# Patient Record
Sex: Male | Born: 1950
Health system: Southern US, Community
[De-identification: ages and names within clinical notes are randomized; demographics above are authoritative.]

## PROBLEM LIST (undated history)

## (undated) DIAGNOSIS — I10 Essential (primary) hypertension: Secondary | ICD-10-CM

## (undated) DIAGNOSIS — E785 Hyperlipidemia, unspecified: Secondary | ICD-10-CM

## (undated) DIAGNOSIS — J449 Chronic obstructive pulmonary disease, unspecified: Secondary | ICD-10-CM

## (undated) DIAGNOSIS — F419 Anxiety disorder, unspecified: Secondary | ICD-10-CM

## (undated) DIAGNOSIS — Z720 Tobacco use: Secondary | ICD-10-CM

## (undated) HISTORY — DX: Essential (primary) hypertension: I10

## (undated) HISTORY — DX: Tobacco use: Z72.0

## (undated) HISTORY — PX: SPINE SURGERY: SHX786

## (undated) HISTORY — DX: Anxiety disorder, unspecified: F41.9

## (undated) HISTORY — DX: Chronic obstructive pulmonary disease, unspecified: J44.9

## (undated) HISTORY — DX: Hyperlipidemia, unspecified: E78.5

## (undated) HISTORY — PX: REDUCTION OF TORSION OF TESTIS: SUR1096

## (undated) HISTORY — PX: APPENDECTOMY: SHX54

---

## 2003-12-29 ENCOUNTER — Ambulatory Visit: Payer: Self-pay | Admitting: Family Medicine

## 2004-01-05 ENCOUNTER — Ambulatory Visit: Payer: Self-pay | Admitting: Family Medicine

## 2004-03-02 ENCOUNTER — Ambulatory Visit: Payer: Self-pay | Admitting: Internal Medicine

## 2004-03-23 ENCOUNTER — Ambulatory Visit: Payer: Self-pay | Admitting: Family Medicine

## 2005-05-01 ENCOUNTER — Ambulatory Visit: Payer: Self-pay | Admitting: Family Medicine

## 2005-05-08 ENCOUNTER — Ambulatory Visit: Payer: Self-pay | Admitting: Family Medicine

## 2005-05-26 ENCOUNTER — Ambulatory Visit: Payer: Self-pay | Admitting: Gastroenterology

## 2005-06-07 ENCOUNTER — Ambulatory Visit: Payer: Self-pay | Admitting: Gastroenterology

## 2006-04-09 ENCOUNTER — Ambulatory Visit: Payer: Self-pay | Admitting: Internal Medicine

## 2006-05-04 ENCOUNTER — Ambulatory Visit: Payer: Self-pay | Admitting: Family Medicine

## 2006-05-04 LAB — CONVERTED CEMR LAB
AST: 17 units/L (ref 0–37)
Albumin: 4 g/dL (ref 3.5–5.2)
BUN: 9 mg/dL (ref 6–23)
Basophils Relative: 0.1 % (ref 0.0–1.0)
Bilirubin, Direct: 0.1 mg/dL (ref 0.0–0.3)
Cholesterol: 155 mg/dL (ref 0–200)
Eosinophils Absolute: 0.2 10*3/uL (ref 0.0–0.6)
HCT: 42.6 % (ref 39.0–52.0)
Hemoglobin: 15.2 g/dL (ref 13.0–17.0)
LDL Cholesterol: 102 mg/dL — ABNORMAL HIGH (ref 0–99)
MCHC: 35.7 g/dL (ref 30.0–36.0)
MCV: 98.1 fL (ref 78.0–100.0)
Monocytes Absolute: 0.7 10*3/uL (ref 0.2–0.7)
Neutro Abs: 4.3 10*3/uL (ref 1.4–7.7)
Neutrophils Relative %: 62.4 % (ref 43.0–77.0)
Platelets: 130 10*3/uL — ABNORMAL LOW (ref 150–400)
RBC: 4.34 M/uL (ref 4.22–5.81)
RDW: 11.6 % (ref 11.5–14.6)
TSH: 1.24 microintl units/mL (ref 0.35–5.50)
Total Bilirubin: 1.1 mg/dL (ref 0.3–1.2)
Total CHOL/HDL Ratio: 4.9
Triglycerides: 106 mg/dL (ref 0–149)
WBC: 7 10*3/uL (ref 4.5–10.5)

## 2006-05-17 ENCOUNTER — Ambulatory Visit: Payer: Self-pay | Admitting: Family Medicine

## 2006-10-01 DIAGNOSIS — I10 Essential (primary) hypertension: Secondary | ICD-10-CM | POA: Insufficient documentation

## 2006-11-15 ENCOUNTER — Telehealth: Payer: Self-pay | Admitting: Family Medicine

## 2007-03-11 ENCOUNTER — Telehealth: Payer: Self-pay | Admitting: Family Medicine

## 2007-06-21 ENCOUNTER — Telehealth: Payer: Self-pay | Admitting: *Deleted

## 2007-07-05 ENCOUNTER — Telehealth: Payer: Self-pay | Admitting: *Deleted

## 2007-09-24 ENCOUNTER — Telehealth: Payer: Self-pay | Admitting: *Deleted

## 2007-10-20 ENCOUNTER — Emergency Department (HOSPITAL_BASED_OUTPATIENT_CLINIC_OR_DEPARTMENT_OTHER): Admission: EM | Admit: 2007-10-20 | Discharge: 2007-10-20 | Payer: Self-pay | Admitting: Emergency Medicine

## 2007-10-30 ENCOUNTER — Ambulatory Visit: Payer: Self-pay | Admitting: Family Medicine

## 2007-10-30 LAB — CONVERTED CEMR LAB
ALT: 15 units/L (ref 0–53)
Albumin: 4.1 g/dL (ref 3.5–5.2)
Alkaline Phosphatase: 55 units/L (ref 39–117)
BUN: 12 mg/dL (ref 6–23)
Basophils Absolute: 0 10*3/uL (ref 0.0–0.1)
Basophils Relative: 0.2 % (ref 0.0–3.0)
Calcium: 8.8 mg/dL (ref 8.4–10.5)
Direct LDL: 134.1 mg/dL
Eosinophils Relative: 2.2 % (ref 0.0–5.0)
Glucose, Bld: 86 mg/dL (ref 70–99)
Hemoglobin: 15.5 g/dL (ref 13.0–17.0)
Monocytes Relative: 7.9 % (ref 3.0–12.0)
Neutro Abs: 4.6 10*3/uL (ref 1.4–7.7)
Nitrite: NEGATIVE
PSA: 0.56 ng/mL (ref 0.10–4.00)
Platelets: 112 10*3/uL — ABNORMAL LOW (ref 150–400)
Potassium: 4 meq/L (ref 3.5–5.1)
Protein, U semiquant: NEGATIVE
RBC: 4.42 M/uL (ref 4.22–5.81)
RDW: 11.8 % (ref 11.5–14.6)
Sodium: 143 meq/L (ref 135–145)
Triglycerides: 100 mg/dL (ref 0–149)
VLDL: 20 mg/dL (ref 0–40)
WBC Urine, dipstick: NEGATIVE
WBC: 7.2 10*3/uL (ref 4.5–10.5)

## 2007-11-05 ENCOUNTER — Ambulatory Visit: Payer: Self-pay | Admitting: Family Medicine

## 2007-11-05 DIAGNOSIS — F17200 Nicotine dependence, unspecified, uncomplicated: Secondary | ICD-10-CM | POA: Insufficient documentation

## 2007-11-19 ENCOUNTER — Ambulatory Visit: Payer: Self-pay | Admitting: Family Medicine

## 2007-12-10 ENCOUNTER — Ambulatory Visit: Payer: Self-pay | Admitting: Family Medicine

## 2007-12-27 ENCOUNTER — Telehealth: Payer: Self-pay | Admitting: *Deleted

## 2008-03-13 ENCOUNTER — Encounter: Payer: Self-pay | Admitting: Family Medicine

## 2008-04-22 ENCOUNTER — Telehealth: Payer: Self-pay | Admitting: Family Medicine

## 2008-07-30 ENCOUNTER — Telehealth: Payer: Self-pay | Admitting: Family Medicine

## 2008-11-09 ENCOUNTER — Telehealth: Payer: Self-pay | Admitting: Family Medicine

## 2009-01-05 ENCOUNTER — Ambulatory Visit: Payer: Self-pay | Admitting: Family Medicine

## 2009-01-05 LAB — CONVERTED CEMR LAB
Albumin: 3.9 g/dL (ref 3.5–5.2)
BUN: 10 mg/dL (ref 6–23)
Basophils Relative: 0.4 % (ref 0.0–3.0)
Calcium: 8.6 mg/dL (ref 8.4–10.5)
Chloride: 107 meq/L (ref 96–112)
Cholesterol: 149 mg/dL (ref 0–200)
GFR calc non Af Amer: 92.44 mL/min (ref >60)
Glucose, Bld: 93 mg/dL (ref 70–99)
HDL: 32.3 mg/dL — ABNORMAL LOW (ref >39.00)
Ketones, urine, test strip: NEGATIVE
Lymphocytes Relative: 29.1 % (ref 12.0–46.0)
MCHC: 34.8 g/dL (ref 30.0–36.0)
MCV: 100.2 fL — ABNORMAL HIGH (ref 78.0–100.0)
Monocytes Absolute: 0.6 10*3/uL (ref 0.1–1.0)
Neutro Abs: 4.2 10*3/uL (ref 1.4–7.7)
Nitrite: NEGATIVE
PSA: 0.73 ng/mL (ref 0.10–4.00)
Platelets: 147 10*3/uL — ABNORMAL LOW (ref 150.0–400.0)
Protein, U semiquant: NEGATIVE
RBC: 3.91 M/uL — ABNORMAL LOW (ref 4.22–5.81)
RDW: 12.1 % (ref 11.5–14.6)
TSH: 0.99 microintl units/mL (ref 0.35–5.50)
Total CHOL/HDL Ratio: 5
Triglycerides: 101 mg/dL (ref 0.0–149.0)
WBC Urine, dipstick: NEGATIVE
pH: 5.5

## 2009-01-18 ENCOUNTER — Ambulatory Visit: Payer: Self-pay | Admitting: Family Medicine

## 2009-01-18 DIAGNOSIS — N529 Male erectile dysfunction, unspecified: Secondary | ICD-10-CM

## 2009-02-26 ENCOUNTER — Telehealth: Payer: Self-pay | Admitting: Family Medicine

## 2009-03-03 ENCOUNTER — Encounter: Payer: Self-pay | Admitting: *Deleted

## 2009-03-03 DIAGNOSIS — F988 Other specified behavioral and emotional disorders with onset usually occurring in childhood and adolescence: Secondary | ICD-10-CM

## 2009-03-09 ENCOUNTER — Encounter: Payer: Self-pay | Admitting: Family Medicine

## 2009-06-08 ENCOUNTER — Telehealth: Payer: Self-pay | Admitting: Family Medicine

## 2009-10-05 ENCOUNTER — Telehealth: Payer: Self-pay | Admitting: Family Medicine

## 2010-01-13 ENCOUNTER — Telehealth: Payer: Self-pay | Admitting: Family Medicine

## 2010-01-19 ENCOUNTER — Other Ambulatory Visit: Payer: Self-pay | Admitting: Family Medicine

## 2010-01-19 ENCOUNTER — Ambulatory Visit
Admission: RE | Admit: 2010-01-19 | Discharge: 2010-01-19 | Payer: Self-pay | Source: Home / Self Care | Attending: Family Medicine | Admitting: Family Medicine

## 2010-01-19 LAB — CBC WITH DIFFERENTIAL/PLATELET
Basophils Absolute: 0.1 10*3/uL (ref 0.0–0.1)
Basophils Relative: 0.7 % (ref 0.0–3.0)
Eosinophils Absolute: 0.2 10*3/uL (ref 0.0–0.7)
Eosinophils Relative: 2.4 % (ref 0.0–5.0)
HCT: 42.9 % (ref 39.0–52.0)
Hemoglobin: 14.6 g/dL (ref 13.0–17.0)
Lymphocytes Relative: 28.3 % (ref 12.0–46.0)
Lymphs Abs: 2 10*3/uL (ref 0.7–4.0)
MCHC: 33.9 g/dL (ref 30.0–36.0)
MCV: 99.3 fl (ref 78.0–100.0)
Monocytes Absolute: 0.6 10*3/uL (ref 0.1–1.0)
Monocytes Relative: 8 % (ref 3.0–12.0)
Neutro Abs: 4.3 10*3/uL (ref 1.4–7.7)
Neutrophils Relative %: 60.6 % (ref 43.0–77.0)
Platelets: 126 10*3/uL — ABNORMAL LOW (ref 150.0–400.0)
RBC: 4.32 Mil/uL (ref 4.22–5.81)
RDW: 13.1 % (ref 11.5–14.6)
WBC: 7 10*3/uL (ref 4.5–10.5)

## 2010-01-19 LAB — LIPID PANEL
Cholesterol: 172 mg/dL (ref 0–200)
HDL: 32.8 mg/dL — ABNORMAL LOW (ref >39.00)
LDL Cholesterol: 120 mg/dL — ABNORMAL HIGH (ref 0–99)
Total CHOL/HDL Ratio: 5
Triglycerides: 95 mg/dL (ref 0.0–149.0)
VLDL: 19 mg/dL (ref 0.0–40.0)

## 2010-01-19 LAB — HEPATIC FUNCTION PANEL
ALT: 10 U/L (ref 0–53)
AST: 13 U/L (ref 0–37)
Albumin: 3.8 g/dL (ref 3.5–5.2)
Alkaline Phosphatase: 64 U/L (ref 39–117)
Bilirubin, Direct: 0.1 mg/dL (ref 0.0–0.3)
Total Bilirubin: 0.7 mg/dL (ref 0.3–1.2)
Total Protein: 6.6 g/dL (ref 6.0–8.3)

## 2010-01-19 LAB — BASIC METABOLIC PANEL
BUN: 14 mg/dL (ref 6–23)
CO2: 28 mEq/L (ref 19–32)
Calcium: 8.8 mg/dL (ref 8.4–10.5)
Chloride: 107 mEq/L (ref 96–112)
Creatinine, Ser: 0.9 mg/dL (ref 0.4–1.5)
GFR: 92.1 mL/min (ref >60.00)
Glucose, Bld: 76 mg/dL (ref 70–99)
Potassium: 4.5 mEq/L (ref 3.5–5.1)
Sodium: 141 mEq/L (ref 135–145)

## 2010-01-19 LAB — PSA: PSA: 0.81 ng/mL (ref 0.10–4.00)

## 2010-01-19 LAB — CONVERTED CEMR LAB
Bilirubin Urine: NEGATIVE
Blood in Urine, dipstick: NEGATIVE
Glucose, Urine, Semiquant: NEGATIVE
Ketones, urine, test strip: NEGATIVE
Protein, U semiquant: NEGATIVE
Specific Gravity, Urine: 1.025
Urobilinogen, UA: 0.2

## 2010-01-19 LAB — TSH: TSH: 1.38 u[IU]/mL (ref 0.35–5.50)

## 2010-02-03 ENCOUNTER — Ambulatory Visit
Admission: RE | Admit: 2010-02-03 | Discharge: 2010-02-03 | Payer: Self-pay | Source: Home / Self Care | Attending: Family Medicine | Admitting: Family Medicine

## 2010-02-03 ENCOUNTER — Encounter: Payer: Self-pay | Admitting: Family Medicine

## 2010-02-15 NOTE — Medication Information (Signed)
Summary: Coverage Approval for Adderall  Coverage Approval for Adderall   Imported By: Maryln Gottron 03/11/2009 13:53:52  _____________________________________________________________________  External Attachment:    Type:   Image     Comment:   External Document

## 2010-02-15 NOTE — Miscellaneous (Signed)
   Clinical Lists Changes  Problems: Added new problem of ATTENTION DEFICIT DISORDER, ADULT (ICD-314.00)

## 2010-02-15 NOTE — Assessment & Plan Note (Signed)
Summary: cpx/njr rsc bmp/njr   Vital Signs:  Patient profile:   60 year old male Height:      68 inches Weight:      171 pounds BMI:     26.09 Temp:     99.1 degrees F oral BP sitting:   130 / 80  (left arm) Cuff size:   regular  Vitals Entered By: Kern Reap CMA Duncan Dull) (January 18, 2009 2:49 PM)  Reason for Visit cpx  History of Present Illness: Bobby Cuevas is a 60 year old, married male, nonsmoker, x 2 days.  The comes in today for evaluation.  He finally decided to stop smoking.  We had initially tried the chantix program however, he did not buy the medication because he was concerned about the potential side effects.  Two days ago, and he stop smoking completely and is using a nicotine patch.  We outlined the process, and he will call if he would like to retry the chantix.  He takes Adderall 20 mg long-acting daily for adult ADD doing well.  He takes Zestril 40 mg daily for hypertension.  BP 130/80.  Also one aspirin tablet daily, and Cardura 8 mg nightly.  He also uses Viagra 50 mg p.r.n. for erectile dysfunction.  He does not get routine eye care, nor dental care.  Advised routine eye exams yearly dental checks every 6 months.  Colonoscopy and GI normal 2008 tetanus booster 2003, seasonal flu 2010.  Allergies: No Known Drug Allergies  Past History:  Past medical, surgical, family and social histories (including risk factors) reviewed, and no changes noted (except as noted below).  Past Medical History: Reviewed history from 10/01/2006 and no changes required. TAB Abuse High cholesterol Hypertension  Past Surgical History: Reviewed history from 10/01/2006 and no changes required. Appendectomy  Family History: Reviewed history from 10/01/2006 and no changes required. Family History of Alcoholism/Addiction Family History Hypertension Family History Other cancer Family History of Respiratory disease  Social History: Reviewed history from 11/05/2007 and no  changes required. Occupation: Married Current Smoker Alcohol use-yes Regular exercise-no  Review of Systems      See HPI  Physical Exam  General:  Well-developed,well-nourished,in no acute distress; alert,appropriate and cooperative throughout examination Head:  Normocephalic and atraumatic without obvious abnormalities. No apparent alopecia or balding. Eyes:  No corneal or conjunctival inflammation noted. EOMI. Perrla. Funduscopic exam benign, without hemorrhages, exudates or papilledema. Vision grossly normal. Ears:  External ear exam shows no significant lesions or deformities.  Otoscopic examination reveals clear canals, tympanic membranes are intact bilaterally without bulging, retraction, inflammation or discharge. Hearing is grossly normal bilaterally. Nose:  External nasal examination shows no deformity or inflammation. Nasal mucosa are pink and moist without lesions or exudates. Mouth:  Oral mucosa and oropharynx without lesions or exudates.  Teeth in good repair. Neck:  No deformities, masses, or tenderness noted. Chest Wall:  No deformities, masses, tenderness or gynecomastia noted. Breasts:  No masses or gynecomastia noted Lungs:  Normal respiratory effort, chest expands symmetrically. Lungs are clear to auscultation, no crackles or wheezes. Heart:  Normal rate and regular rhythm. S1 and S2 normal without gallop, murmur, click, rub or other extra sounds. Abdomen:  Bowel sounds positive,abdomen soft and non-tender without masses, organomegaly or hernias noted. Rectal:  No external abnormalities noted. Normal sphincter tone. No rectal masses or tenderness. Genitalia:  Testes bilaterally descended without nodularity, tenderness or masses. No scrotal masses or lesions. No penis lesions or urethral discharge. Prostate:  Prostate gland firm and smooth,  no enlargement, nodularity, tenderness, mass, asymmetry or induration. Msk:  No deformity or scoliosis noted of thoracic or lumbar  spine.   Pulses:  R and L carotid,radial,femoral,dorsalis pedis and posterior tibial pulses are full and equal bilaterally Extremities:  No clubbing, cyanosis, edema, or deformity noted with normal full range of motion of all joints.   Neurologic:  No cranial nerve deficits noted. Station and gait are normal. Plantar reflexes are down-going bilaterally. DTRs are symmetrical throughout. Sensory, motor and coordinative functions appear intact. Skin:  total body skin exam normal except for scar right lower quadrant from previous appendectomy Cervical Nodes:  No lymphadenopathy noted Axillary Nodes:  No palpable lymphadenopathy Inguinal Nodes:  No significant adenopathy Psych:  Cognition and judgment appear intact. Alert and cooperative with normal attention span and concentration. No apparent delusions, illusions, hallucinations   Impression & Recommendations:  Problem # 1:  TOBACCO ABUSE (ICD-305.1) Assessment Improved  The following medications were removed from the medication list:    Chantix Starting Month Pak 0.5 Mg X 11 & 1 Mg X 42 Misc (Varenicline tartrate) ..... Uad  Orders: Prescription Created Electronically [phone]) Tobacco use cessation intermediate 3-10 minutes (60454)  Problem # 2:  HYPERTENSION (ICD-401.9) Assessment: Improved  The following medications were removed from the medication list:    Zestril 20 Mg Tabs (Lisinopril) .Marland Kitchen... 1 and half  tab @ bedtime His updated medication list for this problem includes:    Doxazosin Mesylate 8 Mg Tabs (Doxazosin mesylate) ..... Once daily    Zestril 40 Mg Tabs (Lisinopril) .Marland Kitchen... Take 1 tablet by mouth every morning  Orders: EKG w/ Interpretation (93000) Prescription Created Electronically [phone]) Tobacco use cessation intermediate 3-10 minutes (91478)  Problem # 3:  ERECTILE DYSFUNCTION, ORGANIC (ICD-607.84) Assessment: Improved  His updated medication list for this problem includes:    Viagra 50 Mg Tabs (Sildenafil  citrate) ..... Uad  Orders: Prescription Created Electronically [phone]) Tobacco use cessation intermediate 3-10 minutes (13086)  Complete Medication List: 1)  Doxazosin Mesylate 8 Mg Tabs (Doxazosin mesylate) .... Once daily 2)  Adult Aspirin Low Strength 81 Mg Tbdp (Aspirin) 3)  Adderall Xr 10 Mg Cp24 (Amphetamine-dextroamphetamine) .... Take 1 tab once daily-fill now 4)  Adderall Xr 10 Mg Cp24 (Amphetamine-dextroamphetamine) .... Take 1 tab once daily fill in one  month 5)  Adderall Xr 10 Mg Cp24 (Amphetamine-dextroamphetamine) .... Take 1 tab once daily fill in 2 months 6)  Viagra 50 Mg Tabs (Sildenafil citrate) .... Uad 7)  Zestril 40 Mg Tabs (Lisinopril) .... Take 1 tablet by mouth every morning  Patient Instructions: 1)  continued the current treatment program with the nicotine patches.  If however, you decide you want to try the chantix call and leave a voicemail for Fleet Contras we can do it by phone. 2)  Please schedule a follow-up appointment in 1 year. 3)  remembered to give a high exam the every one to two years to screen for glaucoma and regular dental care.  I would recommend Dr. Alvester Morin as a dentist Prescriptions: ZESTRIL 40 MG TABS (LISINOPRIL) Take 1 tablet by mouth every morning  #100 x 3   Entered and Authorized by:   Roderick Pee MD   Signed by:   Roderick Pee MD on 01/18/2009   Method used:   Electronically to        [facility]  [address] [phone]* (retail)       [address]  [place], [place]  [address]       Ph: [phone] or [phone]       Fax: [phone]   RxID:   [id number] VIAGRA 50 MG TABS (SILDENAFIL CITRATE) UAD  #6 x 11   Entered and Authorized by:   Roderick Pee MD   Signed by:   Roderick Pee MD on 01/18/2009   Method used:   Electronically to        [facility]  [address] [phone]* (retail)       [address] [place], [place]  [address]       Ph: [phone] or [phone]       Fax: [phone]   RxID:    [id number] DOXAZOSIN MESYLATE 8 MG TABS (DOXAZOSIN MESYLATE) once daily  #100 x 3   Entered and Authorized by:   Roderick Pee MD   Signed by:   Roderick Pee MD on 01/18/2009   Method used:   [facility]        [facility]  [address] [phone][facility] (retail)       [address] [place], [place]  [address]       Ph: [phone] or [phone]       Fax: [phone]   RxID:   [id number]

## 2010-02-15 NOTE — Progress Notes (Signed)
Summary: adderall  Phone Note Call from Patient Call back at Work Phone [phone]   Reason for Call: Refill Medication Summary of Call: Adderall 10mg . Initial call taken by: Rudy Jew, RN,  February 26, 2009 10:34 AM  Follow-up for Phone Call        rx ready for pick up Follow-up by: Kern Reap CMA Duncan Dull),  February 26, 2009 12:50 PM    Prescriptions: ADDERALL XR 10 MG  CP24 (AMPHETAMINE-DEXTROAMPHETAMINE) TAKE 1 TAB once daily FILL IN 2 MONTHS  #30 x 0   Entered by:   Kern Reap CMA (AAMA)   Authorized by:   Roderick Pee MD   Signed by:   Kern Reap CMA (AAMA) on 02/26/2009   Method used:   Print then Give to Patient   RxID:   [id number] ADDERALL XR 10 MG  CP24 (AMPHETAMINE-DEXTROAMPHETAMINE) TAKE 1 TAB once daily FILL IN ONE  MONTH  #30 x 0   Entered by:   Kern Reap CMA (AAMA)   Authorized by:   Roderick Pee MD   Signed by:   Kern Reap CMA (AAMA) on 02/26/2009   Method used:   Print then Give to Patient   RxID:   [id number] ADDERALL XR 10 MG  CP24 (AMPHETAMINE-DEXTROAMPHETAMINE) TAKE 1 TAB once daily-fill now  #30 x 0   Entered by:   Kern Reap CMA (AAMA)   Authorized by:   Roderick Pee MD   Signed by:   Kern Reap CMA (AAMA) on 02/26/2009   Method used:   Print then Give to Patient   RxID:   [id number]

## 2010-02-15 NOTE — Progress Notes (Signed)
Summary: Pt req refill of Adderall XR 10mg  3 month supply  Phone Note Call from Patient Call back at Work Phone [phone]   Caller: Patient Summary of Call: Pt called req script Adderall XR 10mg  3 month supply. Pls call when ready for pick up.   Initial call taken by: Lucy Antigua,  Jun 08, 2009 11:06 AM  Follow-up for Phone Call        rx ready to pick up Follow-up by: Kern Reap CMA Duncan Dull),  Jun 08, 2009 12:15 PM    Prescriptions: ADDERALL XR 10 MG  CP24 (AMPHETAMINE-DEXTROAMPHETAMINE) TAKE 1 TAB once daily FILL IN 2 MONTHS  #30 x 0   Entered by:   Kern Reap CMA (AAMA)   Authorized by:   Roderick Pee MD   Signed by:   Kern Reap CMA (AAMA) on 06/08/2009   Method used:   Print then Give to Patient   RxID:   [id number] ADDERALL XR 10 MG  CP24 (AMPHETAMINE-DEXTROAMPHETAMINE) TAKE 1 TAB once daily FILL IN ONE  MONTH  #30 x 0   Entered by:   Kern Reap CMA (AAMA)   Authorized by:   Roderick Pee MD   Signed by:   Kern Reap CMA (AAMA) on 06/08/2009   Method used:   Print then Give to Patient   RxID:   [id number] ADDERALL XR 10 MG  CP24 (AMPHETAMINE-DEXTROAMPHETAMINE) TAKE 1 TAB once daily-fill now  #30 x 0   Entered by:   Kern Reap CMA (AAMA)   Authorized by:   Roderick Pee MD   Signed by:   Kern Reap CMA (AAMA) on 06/08/2009   Method used:   Print then Give to Patient   RxID:   [id number]

## 2010-02-15 NOTE — Progress Notes (Signed)
Summary: Refill Adderall XR  Phone Note Call from Patient Call back at Work Phone [phone]   Caller: Patient Call For: Roderick Pee MD Summary of Call: pt needs new rx generic adderall xr 10 mg Initial call taken by: Heron Sabins,  October 05, 2009 12:32 PM  Follow-up for Phone Call        Pt was informed that Rx are ready for pick-up Follow-up by: Kathrynn Speed CMA,  October 05, 2009 2:01 PM    Prescriptions: ADDERALL XR 10 MG  CP24 (AMPHETAMINE-DEXTROAMPHETAMINE) TAKE 1 TAB once daily FILL IN 2 MONTHS  #30 x 0   Entered by:   Kern Reap CMA (AAMA)   Authorized by:   Roderick Pee MD   Signed by:   Kern Reap CMA (AAMA) on 10/05/2009   Method used:   Print then Give to Patient   RxID:   [id number] ADDERALL XR 10 MG  CP24 (AMPHETAMINE-DEXTROAMPHETAMINE) TAKE 1 TAB once daily FILL IN ONE  MONTH  #30 x 0   Entered by:   Kern Reap CMA (AAMA)   Authorized by:   Roderick Pee MD   Signed by:   Kern Reap CMA (AAMA) on 10/05/2009   Method used:   Print then Give to Patient   RxID:   [id number] ADDERALL XR 10 MG  CP24 (AMPHETAMINE-DEXTROAMPHETAMINE) TAKE 1 TAB once daily-fill now  #30 x 0   Entered by:   Kern Reap CMA (AAMA)   Authorized by:   Roderick Pee MD   Signed by:   Kern Reap CMA (AAMA) on 10/05/2009   Method used:   Print then Give to Patient   RxID:   [id number]

## 2010-02-17 NOTE — Assessment & Plan Note (Signed)
Summary: cpx//ccm   Vital Signs:  Patient profile:   60 year old male Height:      68 inches Weight:      163 pounds BMI:     24.87 Temp:     98.2 degrees F oral BP sitting:   110 / 70  (left arm) Cuff size:   regular  Vitals Entered By: Kern Reap CMA Duncan Dull) (February 03, 2010 8:35 AM) CC: cpx Is Patient Diabetic? No Pain Assessment Patient in pain? no        CC:  cpx.  History of Present Illness: Bobby Cuevas is a 60 year old, married male, smoker, 30 cigarettes a day, and comes in today for physical examination because of underlying ADHD, hypertension, tobacco abuse.  His hypertension is treated with Zestril 40 mg daily and Cardura 8 mg daily.  BP 110/70.  His ADHD issue with Adderall 10 mg daily long-acting doing well.  Wishes continue on the medication.  He is now willing to try a smoking cessation program.  We outlined the program was chantix.  He gets routine eye care, dental care, colonoscopy, normal, tetanus, 2003, seasonal flu shot 2011  Is not using the Viagra his erectile dysfunction has resolved on its  Allergies (verified): No Known Drug Allergies  Past History:  Past medical, surgical, family and social histories (including risk factors) reviewed, and no changes noted (except as noted below).  Past Medical History: Reviewed history from 10/01/2006 and no changes required. TAB Abuse High cholesterol Hypertension  Past Surgical History: Reviewed history from 10/01/2006 and no changes required. Appendectomy  Family History: Reviewed history from 10/01/2006 and no changes required. Family History of Alcoholism/Addiction Family History Hypertension Family History Other cancer Family History of Respiratory disease  Social History: Reviewed history from 11/05/2007 and no changes required. Occupation: Married Current Smoker Alcohol use-yes Regular exercise-no  Review of Systems      See HPI  Physical Exam  General:   Well-developed,well-nourished,in no acute distress; alert,appropriate and cooperative throughout examination Head:  Normocephalic and atraumatic without obvious abnormalities. No apparent alopecia or balding. Eyes:  No corneal or conjunctival inflammation noted. EOMI. Perrla. Funduscopic exam benign, without hemorrhages, exudates or papilledema. Vision grossly normal. Ears:  External ear exam shows no significant lesions or deformities.  Otoscopic examination reveals clear canals, tympanic membranes are intact bilaterally without bulging, retraction, inflammation or discharge. Hearing is grossly normal bilaterally. Nose:  External nasal examination shows no deformity or inflammation. Nasal mucosa are pink and moist without lesions or exudates. Mouth:  Oral mucosa and oropharynx without lesions or exudates.  Teeth in good repair. Neck:  No deformities, masses, or tenderness noted. Chest Wall:  No deformities, masses, tenderness or gynecomastia noted. Breasts:  No masses or gynecomastia noted Lungs:  Normal respiratory effort, chest expands symmetrically. Lungs are clear to auscultation, no crackles or wheezes. Heart:  Normal rate and regular rhythm. S1 and S2 normal without gallop, murmur, click, rub or other extra sounds. Abdomen:  Bowel sounds positive,abdomen soft and non-tender without masses, organomegaly or hernias noted. Rectal:  No external abnormalities noted. Normal sphincter tone. No rectal masses or tenderness. Genitalia:  Testes bilaterally descended without nodularity, tenderness or masses. No scrotal masses or lesions. No penis lesions or urethral discharge. Prostate:  Prostate gland firm and smooth, no enlargement, nodularity, tenderness, mass, asymmetry or induration. Msk:  No deformity or scoliosis noted of thoracic or lumbar spine.   Pulses:  R and L carotid,radial,femoral,dorsalis pedis and posterior tibial pulses are full and equal bilaterally  Extremities:  No clubbing, cyanosis,  edema, or deformity noted with normal full range of motion of all joints.   Neurologic:  No cranial nerve deficits noted. Station and gait are normal. Plantar reflexes are down-going bilaterally. DTRs are symmetrical throughout. Sensory, motor and coordinative functions appear intact. Skin:  total body skin exam normal, except he has a very ruddy complexion, cold hands and feet from chronic tobacco abuse.  However, interestingly enough, all his pulses are normal Cervical Nodes:  No lymphadenopathy noted Axillary Nodes:  No palpable lymphadenopathy Inguinal Nodes:  No significant adenopathy Psych:  Cognition and judgment appear intact. Alert and cooperative with normal attention span and concentration. No apparent delusions, illusions, hallucinations   Impression & Recommendations:  Problem # 1:  ATTENTION DEFICIT DISORDER, ADULT (ICD-314.00) Assessment Improved  Orders: Prescription Created Electronically [phone]) Tobacco use cessation intermediate 3-10 minutes (37106)  Problem # 2:  TOBACCO ABUSE (ICD-305.1) Assessment: Unchanged  His updated medication list for this problem includes:    Chantix Continuing Month Pak 1 Mg Tabs (Varenicline tartrate) ..... Uad  Orders: Prescription Created Electronically [phone]) Tobacco use cessation intermediate 3-10 minutes (99406) T-2 View CXR (71020TC)  Problem # 3:  HYPERTENSION (ICD-401.9) Assessment: Improved  His updated medication list for this problem includes:    Doxazosin Mesylate 8 Mg Tabs (Doxazosin mesylate) ..... Once daily    Zestril 40 Mg Tabs (Lisinopril) .Marland Kitchen... Take 1 tablet by mouth every morning  Orders: Prescription Created Electronically [phone]) Tobacco use cessation intermediate 3-10 minutes (03500)  Problem # 4:  Preventive Health Care (ICD-V70.0) Assessment: Unchanged  Complete Medication List: 1)  Doxazosin Mesylate 8 Mg Tabs (Doxazosin mesylate) .... Once daily 2)  Adult Aspirin Low Strength 81 Mg Tbdp  (Aspirin) 3)  Adderall Xr 10 Mg Cp24 (Amphetamine-dextroamphetamine) .... Take 1 tab once daily-fill now 4)  Adderall Xr 10 Mg Cp24 (Amphetamine-dextroamphetamine) .... Take 1 tab once daily fill in one  month 5)  Adderall Xr 10 Mg Cp24 (Amphetamine-dextroamphetamine) .... Take 1 tab once daily fill in 2 months 6)  Zestril 40 Mg Tabs (Lisinopril) .... Take 1 tablet by mouth every morning 7)  Chantix Continuing Month Pak 1 Mg Tabs (Varenicline tartrate) .... Uad  Patient Instructions: 1)  begin chantix tomorrow by taking a half a tablet a day in the morning and beginning a tapering program.......20/15/10/5...........Marland Kitchen with a follow-up in 4 weeks. Prescriptions: ZESTRIL 40 MG TABS (LISINOPRIL) Take 1 tablet by mouth every morning  #100 x 3   Entered and Authorized by:   Roderick Pee MD   Signed by:   Roderick Pee MD on 02/03/2010   Method used:   [facility]        [facility]  [address] [phone]* (retail)       [address] [place], [place]  [address]       Ph: [phone] or [phone]       Fax: [phone]   RxID:   [id number] DOXAZOSIN MESYLATE 8 MG TABS (DOXAZOSIN MESYLATE) once daily  #100 x 3   Entered and Authorized by:   Roderick Pee MD   Signed by:   Roderick Pee MD on 02/03/2010   Method used:   [facility]        [facility]  [address] [phone][facility] (retail)       [address]  [place], [place]  [address]       Ph: [phone] or [phone]       Fax: [phone]   RxID:   [id number] CHANTIX CONTINUING MONTH PAK 1 MG TABS (VARENICLINE TARTRATE) UAD  #1 x 3   Entered and Authorized by:   Roderick Pee MD   Signed by:   Roderick Pee MD on 02/03/2010   Method used:   [facility]        [facility]  [address] [phone]* (retail)       [address] [place], [place]  [address]       Ph: [phone] or [phone]       Fax: [phone]   RxID:   [id number]    Orders Added: 1)  Prescription Created  Electronically [phone] 2)  Tobacco use cessation intermediate 3-10 minutes [99406] 3)  Est. Patient 40-64 years [99396] 4)  T-2 View CXR [71020TC]   Immunization History:  Influenza Immunization History:    Influenza:  historical (10/16/2009)   Immunization History:  Influenza Immunization History:    Influenza:  Historical (10/16/2009)     Appended Document: Orders Update     Clinical Lists Changes  Orders: Added new Service order of EKG w/ Interpretation (93000) - Signed

## 2010-02-17 NOTE — Progress Notes (Signed)
Summary: adderall refill  Phone Note Refill Request Message from:  Patient on January 13, 2010 10:30 AM  Refills Requested: Medication #1:  ADDERALL XR 10 MG  CP24 TAKE 1 TAB once daily-fill now Initial call taken by: Kern Reap CMA (AAMA),  January 13, 2010 10:30 AM    Prescriptions: ADDERALL XR 10 MG  CP24 (AMPHETAMINE-DEXTROAMPHETAMINE) TAKE 1 TAB once daily FILL IN 2 MONTHS  #30 x 0   Entered by:   Kern Reap CMA (AAMA)   Authorized by:   Gordy Savers  MD   Signed by:   Kern Reap CMA (AAMA) on 01/13/2010   Method used:   Print then Give to Patient   RxID:   [id number] ADDERALL XR 10 MG  CP24 (AMPHETAMINE-DEXTROAMPHETAMINE) TAKE 1 TAB once daily FILL IN ONE  MONTH  #30 x 0   Entered by:   Kern Reap CMA (AAMA)   Authorized by:   Gordy Savers  MD   Signed by:   Kern Reap CMA (AAMA) on 01/13/2010   Method used:   Print then Give to Patient   RxID:   [id number] ADDERALL XR 10 MG  CP24 (AMPHETAMINE-DEXTROAMPHETAMINE) TAKE 1 TAB once daily-fill now  #30 x 0   Entered by:   Kern Reap CMA (AAMA)   Authorized by:   Gordy Savers  MD   Signed by:   Kern Reap CMA (AAMA) on 01/13/2010   Method used:   Print then Give to Patient   RxID:   [id number]

## 2010-03-03 ENCOUNTER — Ambulatory Visit: Payer: Self-pay | Admitting: Family Medicine

## 2010-03-16 ENCOUNTER — Encounter: Payer: Self-pay | Admitting: Family Medicine

## 2010-03-17 ENCOUNTER — Encounter: Payer: Self-pay | Admitting: Family Medicine

## 2010-03-17 ENCOUNTER — Ambulatory Visit (INDEPENDENT_AMBULATORY_CARE_PROVIDER_SITE_OTHER): Payer: Federal, State, Local not specified - PPO | Admitting: Family Medicine

## 2010-03-17 DIAGNOSIS — F17200 Nicotine dependence, unspecified, uncomplicated: Secondary | ICD-10-CM

## 2010-03-17 NOTE — Patient Instructions (Signed)
Add  a half of a tablet at bedtime, continue the full tablet in the morning, if, in one month you are still smoking,  increase the chantix to a full tablet twice daily.  Return in two months for follow-up

## 2010-03-17 NOTE — Progress Notes (Signed)
  Subjective:    Patient ID: Bobby Cuevas, male    DOB: 29-Aug-1950, 60 y.o.   MRN: [id number]  HPIGeorge is a 60 year old male, who comes in today for follow-up of tobacco abuse.  On the chantix program taking a tablet 1 mg daily in the morning.  He Has decreased his n nicotine consumption to a pack a day.  We talked about various strategies and outlined a program to increase the chantix over a couple months.  No side effects from medication    Review of Systems    n Objective:   Physical Exam     Well-developed, well-nourished, male, ruddy complexion from tobacco abuse.  No acute distress    Assessment & Plan:  Tobacco abuse,,,,,,,,,,, decrease consumption continue chantix

## 2010-03-28 ENCOUNTER — Other Ambulatory Visit: Payer: Self-pay | Admitting: Family Medicine

## 2010-04-29 ENCOUNTER — Telehealth: Payer: Self-pay | Admitting: *Deleted

## 2010-04-29 NOTE — Telephone Encounter (Signed)
Patient is requesting a refill for adderall xr 10

## 2010-05-02 MED ORDER — AMPHETAMINE-DEXTROAMPHET ER 10 MG PO CP24: 10.0000 mg | ORAL_CAPSULE | Freq: Every day | ORAL | Status: DC

## 2010-05-02 NOTE — Telephone Encounter (Signed)
rx ready for pickup 

## 2010-05-18 ENCOUNTER — Encounter: Payer: Self-pay | Admitting: Family Medicine

## 2010-05-18 ENCOUNTER — Ambulatory Visit (INDEPENDENT_AMBULATORY_CARE_PROVIDER_SITE_OTHER): Payer: Federal, State, Local not specified - PPO | Admitting: Family Medicine

## 2010-05-18 VITALS — BP 120/78 | Temp 98.6°F | Wt 170.0 lb

## 2010-05-18 DIAGNOSIS — F17200 Nicotine dependence, unspecified, uncomplicated: Secondary | ICD-10-CM

## 2010-05-18 MED ORDER — VARENICLINE TARTRATE 1 MG PO TABS: 1.0000 mg | ORAL_TABLET | Freq: Two times a day (BID) | ORAL | Status: AC

## 2010-05-18 NOTE — Progress Notes (Signed)
  Subjective:    Patient ID: Bobby Cuevas, male    DOB: December 27, 1950, 60 y.o.   MRN: [id number]  HPI Bobby Cuevas is a 60 year old male, who returns for evaluation of tobacco abuse.  We put him on a program of Entex and he increased his dose to one tablet in the morning and half at bed time.  With this he cut his cigarette consumption from two packs a day to less than one.  He's been off the chantix now for one month and is beginning to increase his cigarette consumption.  Again.  We talked about going on the medication long term.  He had no major side effects.   Review of Systems General and psychiatric review of systems otherwise negative    Objective:   Physical Exam Well-developed well-nourished, male in no acute distress       Assessment & Plan:  Tobacco abuse,,,,,,,, restart chantix slowly one half tab daily and increase as outlined.  Follow-up in 6 months

## 2010-05-18 NOTE — Patient Instructions (Signed)
Restart the Zantac, slowly by taking a half a tablet a day for one week and a half a tablet twice a day, then up to one tablet in the morning and half a tablet at bedtime.  Begin a tapering program by decreasing your consumption two cigarettes per week until he dropped to zero.  Return in 3 months for follow-up, sooner if any problems

## 2010-08-08 ENCOUNTER — Other Ambulatory Visit: Payer: Self-pay | Admitting: *Deleted

## 2010-08-08 NOTE — Telephone Encounter (Signed)
patient  Is calling for a refill of adderall

## 2010-08-09 MED ORDER — AMPHETAMINE-DEXTROAMPHET ER 10 MG PO CP24: 10.0000 mg | ORAL_CAPSULE | Freq: Every day | ORAL | Status: DC

## 2010-08-24 ENCOUNTER — Ambulatory Visit (INDEPENDENT_AMBULATORY_CARE_PROVIDER_SITE_OTHER): Payer: Federal, State, Local not specified - PPO | Admitting: Family Medicine

## 2010-08-24 ENCOUNTER — Encounter: Payer: Self-pay | Admitting: Family Medicine

## 2010-08-24 DIAGNOSIS — F17200 Nicotine dependence, unspecified, uncomplicated: Secondary | ICD-10-CM

## 2010-08-24 DIAGNOSIS — R635 Abnormal weight gain: Secondary | ICD-10-CM

## 2010-08-24 NOTE — Patient Instructions (Signed)
Taper by one per week.  Follow-up in two months

## 2010-08-24 NOTE — Progress Notes (Signed)
  Subjective:    Patient ID: Bobby Cuevas, male    DOB: 1950/05/30, 60 y.o.   MRN: [id number]  HPIGeorge is a 60 year old man male, smoker, who comes in today for follow-up of tobacco abuse.  We started him on a smoking cessation program, which Entex.  Is currently taking 1 mg b.i.d. And is cut his cigarette consumption down to 10 a day.  With outlined a program where he drops, one cigarette per week.  No side effects from medication.  Weight up 6 pounds    Review of Systems General review of systems otherwise negative    Objective:   Physical Exam Well-developed well-nourished man in no acute distress      Assessment & Plan:  Tobacco abuse with decrease in smoking with the chantix program.  Continue program taper by one per week.  Follow-up in two months

## 2010-10-24 ENCOUNTER — Ambulatory Visit (INDEPENDENT_AMBULATORY_CARE_PROVIDER_SITE_OTHER): Payer: Federal, State, Local not specified - PPO | Admitting: Family Medicine

## 2010-10-24 ENCOUNTER — Encounter: Payer: Self-pay | Admitting: Family Medicine

## 2010-10-24 DIAGNOSIS — I10 Essential (primary) hypertension: Secondary | ICD-10-CM

## 2010-10-24 DIAGNOSIS — F17200 Nicotine dependence, unspecified, uncomplicated: Secondary | ICD-10-CM

## 2010-10-24 DIAGNOSIS — Z23 Encounter for immunization: Secondary | ICD-10-CM

## 2010-10-24 NOTE — Patient Instructions (Signed)
Continue the chantix, one pill twice daily.........Marland KitchenReturn in two months for follow-up  Started tapering program by two cigarettes per week.  Checked y  blood pressure daily in the morning for the next 3 weeks..........Marland Kitchen If y  blood pressure is not at goal.........Marland Kitchen 135/85 or less........ Then return in 3 weeks for follow-up

## 2010-10-24 NOTE — Progress Notes (Signed)
  Subjective:    Patient ID: Bobby Cuevas, male    DOB: 1950/07/16, 60 y.o.   MRN: [id number]  HPIGeorge is a 60 year old, married male, smoker, who comes in today for follow-up of tobacco abuse, and hypertension.  He is currently taking a full tablet of chantix twice daily however, he continues to smoke anywhere from 10 to 20 cigarettes a day.  We talked about various strategies.  His current try to taper by two per week and continue the Entex.  Blood pressure today 140/90 on Cardura 8 mg daily and lisinopril 40 mg daily.  I asked him to monitor his blood pressure daily in the morning for the next 3 to 4 weeks to be sure it is back to normal.  He, thinks it's elevated because he does smoke a cigarette    Review of Systems General and psychiatric review of systems cardiovascular review of systems otherwise negative    Objective:   Physical Exam  Well-developed well-nourished, male in no acute distress except for extreme  Red  complexion      Assessment & Plan:  Tobacco abuse, taper by two per week.  Continue the Entex twice daily.  Follow-up in two months.  Check your blood pressure daily in the morning for the next 3 weeks,,,,,,,, goal 135/85 or less,,,,,,, if not, at goal return to see me in 3 weeks for follow-up on your blood pressure

## 2010-10-24 NOTE — Progress Notes (Signed)
Addended by: Azucena Freed on: 10/24/2010 09:57 AM   Modules accepted: Orders

## 2010-11-10 ENCOUNTER — Other Ambulatory Visit: Payer: Self-pay

## 2010-11-10 NOTE — Telephone Encounter (Signed)
Pt called and stated he needs a refill on his adderall.  Pt last seen 10/24/10. Pls advise.

## 2010-11-11 ENCOUNTER — Other Ambulatory Visit: Payer: Self-pay | Admitting: *Deleted

## 2010-11-11 MED ORDER — AMPHETAMINE-DEXTROAMPHET ER 10 MG PO CP24: 10.0000 mg | ORAL_CAPSULE | Freq: Every day | ORAL | Status: DC

## 2010-11-14 NOTE — Telephone Encounter (Signed)
ok 

## 2010-11-14 NOTE — Telephone Encounter (Signed)
rx ready for pick up. Left message on machine for patient. 

## 2010-12-27 ENCOUNTER — Ambulatory Visit (INDEPENDENT_AMBULATORY_CARE_PROVIDER_SITE_OTHER): Payer: Federal, State, Local not specified - PPO | Admitting: Family Medicine

## 2010-12-27 ENCOUNTER — Encounter: Payer: Self-pay | Admitting: Family Medicine

## 2010-12-27 DIAGNOSIS — J069 Acute upper respiratory infection, unspecified: Secondary | ICD-10-CM

## 2010-12-27 DIAGNOSIS — F17200 Nicotine dependence, unspecified, uncomplicated: Secondary | ICD-10-CM

## 2010-12-27 MED ORDER — HYDROCODONE-HOMATROPINE 5-1.5 MG/5ML PO SYRP: ORAL_SOLUTION | ORAL | Status: DC

## 2010-12-27 NOTE — Patient Instructions (Signed)
Continued the chantix one tab twice daily for 3 months, then slowly taper.  Tylenol lots of liquids, Hydromet p.r.n. For cough

## 2010-12-27 NOTE — Progress Notes (Signed)
  Subjective:    Patient ID: Bobby Cuevas, male    DOB: 06-02-1950, 60 y.o.   MRN: [id number]  HPIGeorge is a 60 year old male, who comes in today for two problems.  We have been working with him over the past couple months on a smoking cessation program with the chantix and he last smoked two days ago.  He is committed to quitting completely.  The past 5 days had a viral syndrome with head congestion, sore throat, and cough   Review of Systems And one, pulmonary via systems otherwise negative    Objective:   Physical Exam Well-developed well-nourished man in no acute distress.  HEENT negative.  Neck was supple.  No adenopathy.  Lungs are clear       Assessment & Plan:  Tobacco abuse, off cigarettes for 3 days continue chantix one half tab b.i.d. X 3 months.  Viral syndrome.  Treat symptomatically

## 2011-03-08 ENCOUNTER — Other Ambulatory Visit: Payer: Self-pay | Admitting: *Deleted

## 2011-03-08 NOTE — Telephone Encounter (Signed)
Patient calling for a refill of Adderall

## 2011-03-09 MED ORDER — AMPHETAMINE-DEXTROAMPHET ER 10 MG PO CP24: 10.0000 mg | ORAL_CAPSULE | Freq: Every day | ORAL | Status: DC

## 2011-03-09 NOTE — Telephone Encounter (Signed)
RX ready for pick up and Left message on machine

## 2011-03-26 ENCOUNTER — Other Ambulatory Visit: Payer: Self-pay | Admitting: Family Medicine

## 2011-06-19 ENCOUNTER — Other Ambulatory Visit: Payer: Self-pay

## 2011-06-19 MED ORDER — AMPHETAMINE-DEXTROAMPHET ER 10 MG PO CP24: 10.0000 mg | ORAL_CAPSULE | Freq: Every day | ORAL | Status: DC

## 2011-06-19 NOTE — Telephone Encounter (Signed)
Pt calling for a refill of his Adderall.  Last filled around the beginning of March.  He is going out of town this Friday.

## 2011-06-20 NOTE — Telephone Encounter (Signed)
Rx ready for pick up.  Left message on machine for patient. 

## 2011-10-03 ENCOUNTER — Telehealth: Payer: Self-pay | Admitting: Family Medicine

## 2011-10-03 NOTE — Telephone Encounter (Signed)
Patient called stating that he need a refill of his adderall. Please assist.  °

## 2011-10-04 MED ORDER — AMPHETAMINE-DEXTROAMPHET ER 10 MG PO CP24: 10.0000 mg | ORAL_CAPSULE | Freq: Every day | ORAL | Status: DC

## 2011-10-04 NOTE — Telephone Encounter (Signed)
Rx ready for pick up and patient is aware 

## 2012-01-31 ENCOUNTER — Telehealth: Payer: Self-pay | Admitting: Family Medicine

## 2012-01-31 NOTE — Telephone Encounter (Signed)
Pt requesting refill rx on amphetamine-dextroamphetamine (ADDERALL XR) 10 MG 24 hr capsule once daily. Requesting (3) 30-day rx. Please call when ready for pick up.  Last seen Dec. 2012. OV needed?

## 2012-02-01 MED ORDER — AMPHETAMINE-DEXTROAMPHET ER 10 MG PO CP24: 10.0000 mg | ORAL_CAPSULE | Freq: Every day | ORAL | Status: DC

## 2012-02-01 NOTE — Telephone Encounter (Signed)
Okay to refill Adderall x3 months

## 2012-02-01 NOTE — Telephone Encounter (Signed)
Rx ready for pick up,  Appointment needed for more refills,  Patient is aware

## 2012-02-24 ENCOUNTER — Other Ambulatory Visit: Payer: Self-pay | Admitting: Family Medicine

## 2012-03-20 ENCOUNTER — Other Ambulatory Visit (INDEPENDENT_AMBULATORY_CARE_PROVIDER_SITE_OTHER): Payer: BC Managed Care – PPO

## 2012-03-20 LAB — LIPID PANEL
Cholesterol: 172 mg/dL (ref 0–200)
LDL Cholesterol: 119 mg/dL — ABNORMAL HIGH (ref 0–99)
Total CHOL/HDL Ratio: 5
Triglycerides: 110 mg/dL (ref 0.0–149.0)
VLDL: 22 mg/dL (ref 0.0–40.0)

## 2012-03-20 LAB — HEPATIC FUNCTION PANEL
ALT: 15 U/L (ref 0–53)
Albumin: 4 g/dL (ref 3.5–5.2)
Alkaline Phosphatase: 67 U/L (ref 39–117)
Total Protein: 6.5 g/dL (ref 6.0–8.3)

## 2012-03-20 LAB — CBC WITH DIFFERENTIAL/PLATELET
Basophils Absolute: 0 10*3/uL (ref 0.0–0.1)
Basophils Relative: 0.4 % (ref 0.0–3.0)
Hemoglobin: 15.2 g/dL (ref 13.0–17.0)
MCHC: 34.7 g/dL (ref 30.0–36.0)
MCV: 96.5 fl (ref 78.0–100.0)
RBC: 4.52 Mil/uL (ref 4.22–5.81)
RDW: 13 % (ref 11.5–14.6)
WBC: 8 10*3/uL (ref 4.5–10.5)

## 2012-03-20 LAB — POCT URINALYSIS DIPSTICK
Glucose, UA: NEGATIVE
Nitrite, UA: NEGATIVE
Protein, UA: NEGATIVE
pH, UA: 6

## 2012-03-20 LAB — BASIC METABOLIC PANEL
Creatinine, Ser: 0.9 mg/dL (ref 0.4–1.5)
GFR: 93.82 mL/min (ref >60.00)

## 2012-03-21 ENCOUNTER — Telehealth: Payer: Self-pay | Admitting: Family Medicine

## 2012-03-21 MED ORDER — DOXAZOSIN MESYLATE 8 MG PO TABS: ORAL_TABLET | ORAL | Status: DC

## 2012-03-21 NOTE — Telephone Encounter (Signed)
rx sent

## 2012-03-21 NOTE — Telephone Encounter (Signed)
Rescheduled from bump.Pt is requesting refill on doxazosin (CARDURA) 8 MG tablet  [facility]

## 2012-03-27 ENCOUNTER — Encounter: Payer: Federal, State, Local not specified - PPO | Admitting: Family Medicine

## 2012-04-03 ENCOUNTER — Encounter: Payer: Self-pay | Admitting: Family

## 2012-04-03 ENCOUNTER — Ambulatory Visit (INDEPENDENT_AMBULATORY_CARE_PROVIDER_SITE_OTHER): Payer: BC Managed Care – PPO | Admitting: Family

## 2012-04-03 VITALS — BP 140/80 | HR 76 | Ht 68.5 in | Wt 176.0 lb

## 2012-04-03 DIAGNOSIS — Z0000 Encounter for general adult medical examination without abnormal findings: Secondary | ICD-10-CM

## 2012-04-03 DIAGNOSIS — F17200 Nicotine dependence, unspecified, uncomplicated: Secondary | ICD-10-CM

## 2012-04-03 MED ORDER — VARENICLINE TARTRATE 0.5 MG PO TABS: 0.5000 mg | ORAL_TABLET | Freq: Two times a day (BID) | ORAL | Status: DC

## 2012-04-03 MED ORDER — LISINOPRIL 40 MG PO TABS: 40.0000 mg | ORAL_TABLET | Freq: Every day | ORAL | Status: DC

## 2012-04-03 NOTE — Patient Instructions (Signed)
Benign Prostatic Hyperplasia You have an enlarged prostate. This is common in elderly males. It is called BPH. This stands for benign prostate hyperplasia. The prostate gland is located in base of the bladder. When it grows, the prostate blocks the urethra. This is the tube which drains urine from the bladder.  SYMPTOMS  Weak urine stream.  Dribbling.  Feeling like the bladder has not emptied completely.  Difficulty starting urination.  Getting up frequently at night to urinate.  Urinating more frequently during the day. Complete urinary blockage or severe pain with urination requires immediate attention. DIAGNOSIS   Your caregiver often has a good idea what is wrong by taking a history and doing a physical exam.  Special x-rays may be done. TREATMENT   For mild problems, no treatment may be necessary.  If the problems are moderate, medications may provide relief. Some of these work by making the prostate gland smaller. The herb saw palmetto is commonly used.  If complete blockage occurs, a Foley catheter is usually left in place for a few days.  Surgery is often needed for more severe problems. TURP is the prostate surgery for BPH which is done through the urethra. TURP stands for transurethral resection of the prostate. It involves cutting away chips from the prostate. It is done by removing chips so that they can come out through the penis.  Techniques using heat, microwave and laser to remove the prostate blockage are also being used. HOME CARE INSTRUCTIONS   Give yourself time when you urinate.  Stay away from alcohol.  Beverages containing caffeine such as coffee, tea and colas can make the problems worse.  Decongestants, antihistamines, and some prescription medicines can also make the problem worse.  Follow up with your caregiver for further treatment as recommended. SEEK IMMEDIATE MEDICAL CARE IF:   You develop increased pain with urination or are unable to pass  your water.  You develop severe abdominal pain, vomiting, a high fever, or fainting.  You develop back pain or blood in your urine. MAKE SURE YOU:   Understand these instructions.  Will watch your condition.  Will get help right away if you are not doing well or get worse. Document Released: 01/02/2005 Document Revised: 03/27/2011 Document Reviewed: 09/07/2006 [employer] Patient Information 2013 [employer].  

## 2012-04-03 NOTE — Progress Notes (Signed)
Subjective:    Patient ID: Bobby Cuevas, male    DOB: June 26, 1950, 62 y.o.   MRN: [id number]  HPI Patient presents for yearly preventative medicine examination. All immunizations and health maintenance protocols were reviewed with the patient and they are up to date with these protocols. Screening laboratory values were reviewed with the patient including screening of hyperlipidemia PSA renal function and hepatic function. There medications past medical history social history problem list and allergies were reviewed in detail. Goals were established with regard to weight loss exercise diet in compliance with medications  Patient has not been taking Lisinopril. Reports not being sure if he was suppose to take it or not.   Review of Systems  Constitutional: Negative.   HENT: Negative.   Eyes: Negative.   Respiratory: Negative.   Cardiovascular: Negative.   Gastrointestinal: Negative.   Endocrine: Negative.   Genitourinary: Negative.   Musculoskeletal: Negative.   Skin: Negative.   Allergic/Immunologic: Negative.   Neurological: Negative.   Hematological: Negative.   Psychiatric/Behavioral: Negative.    Past Medical History  Diagnosis Date  . Tobacco abuse   . Hyperlipidemia   . Hypertension     History   Social History  . Marital Status: Married    Spouse Name: N/A    Number of Children: N/A  . Years of Education: N/A   Occupational History  . Not on file.   Social History Main Topics  . Smoking status: Current Every Day Smoker -- 0.50 packs/day    Types: Cigarettes  . Smokeless tobacco: Not on file  . Alcohol Use: Yes  . Drug Use: No  . Sexually Active: Not on file   Other Topics Concern  . Not on file   Social History Narrative  . No narrative on file    Past Surgical History  Procedure Laterality Date  . Appendectomy      Family History  Problem Relation Age of Onset  . Alcohol abuse Other   . Hypertension Other   . Cancer Other   . Lung  disease Other     No Known Allergies  Current Outpatient Prescriptions on File Prior to Visit  Medication Sig Dispense Refill  . amphetamine-dextroamphetamine (ADDERALL XR) 10 MG 24 hr capsule Take 1 capsule (10 mg total) by mouth daily.  30 capsule  0  . amphetamine-dextroamphetamine (ADDERALL XR) 10 MG 24 hr capsule Take 1 capsule (10 mg total) by mouth daily.  30 capsule  0  . amphetamine-dextroamphetamine (ADDERALL XR) 10 MG 24 hr capsule Take 1 capsule (10 mg total) by mouth daily.  30 capsule  0  . aspirin 81 MG tablet Take 81 mg by mouth daily.        Marland Kitchen doxazosin (CARDURA) 8 MG tablet TAKE 1 TABLET EVERY MORNING  90 tablet  3  . HYDROcodone-homatropine (HYDROMET) 5-1.5 MG/5ML syrup One half to 1 teaspoon at bedtime p.r.n. For cough and cold  120 mL  1   No current facility-administered medications on file prior to visit.    BP 140/80  Pulse 76  Ht 5' 8.5" (1.74 m)  Wt 176 lb (79.833 kg)  BMI 26.37 kg/m2  SpO2 98%chart    Objective:   Physical Exam  Constitutional: He is oriented to person, place, and time. He appears well-developed and well-nourished.  HENT:  Right Ear: External ear normal.  Left Ear: External ear normal.  Nose: Nose normal.  Mouth/Throat: Oropharynx is clear and moist.  Eyes: Conjunctivae and EOM are normal.  Pupils are equal, round, and reactive to light.  Neck: Normal range of motion. Neck supple. No thyromegaly present.  Cardiovascular: Normal rate, regular rhythm and normal heart sounds.   Pulmonary/Chest: Effort normal and breath sounds normal.  Abdominal: Soft. Bowel sounds are normal.  Genitourinary: Rectum normal, prostate normal and penis normal.  Musculoskeletal: Normal range of motion.  Neurological: He is alert and oriented to person, place, and time. He has normal reflexes.  Skin: Skin is warm and dry.  Psychiatric: He has a normal mood and affect.          Assessment & Plan:  Assessment:   1. CPX 2. Hypertension 3. BPH 4.  ADD  Plan: Resume Lisinopril. Continue all other medications. Exercise 30 min daily. Refer for colonoscopy. Follow-up in 4 months and sooner as needed.

## 2012-05-13 ENCOUNTER — Ambulatory Visit: Payer: BC Managed Care – PPO | Admitting: Family Medicine

## 2012-05-15 ENCOUNTER — Telehealth: Payer: Self-pay | Admitting: Family Medicine

## 2012-05-15 NOTE — Telephone Encounter (Signed)
Pt calling to request a refill of his amphetamine-dextroamphetamine (ADDERALL XR) 10 MG 24 hr capsule. He takes this once a day, and he is requesting 3 refills. Please assist.

## 2012-05-16 MED ORDER — AMPHETAMINE-DEXTROAMPHET ER 10 MG PO CP24: 10.0000 mg | ORAL_CAPSULE | Freq: Every day | ORAL | Status: DC

## 2012-05-17 NOTE — Telephone Encounter (Signed)
Rx ready for pick up and patient is aware 

## 2012-06-13 ENCOUNTER — Encounter: Payer: Self-pay | Admitting: Family Medicine

## 2012-06-13 ENCOUNTER — Ambulatory Visit (INDEPENDENT_AMBULATORY_CARE_PROVIDER_SITE_OTHER): Payer: BC Managed Care – PPO | Admitting: Family Medicine

## 2012-06-13 VITALS — BP 140/90 | Temp 98.7°F | Wt 176.0 lb

## 2012-06-13 DIAGNOSIS — F17200 Nicotine dependence, unspecified, uncomplicated: Secondary | ICD-10-CM

## 2012-06-13 MED ORDER — VARENICLINE TARTRATE 1 MG PO TABS: 1.0000 mg | ORAL_TABLET | Freq: Two times a day (BID) | ORAL | Status: DC

## 2012-06-13 NOTE — Progress Notes (Signed)
  Subjective:    Patient ID: Bobby Cuevas, male    DOB: 06-01-1950, 62 y.o.   MRN: [id number]  HPI Bobby Cuevas is a 62 year old male who comes in today to discuss smoking cessation  He restarted his Chantix a half a milligram twice a day about a month ago. He was over a pack a cigarettes a day. He's cut down to 18 cigarettes a day. No side effects to medication   Review of Systems    review of systems otherwise negative Objective:   Physical Exam  Well-developed well-nourished male no acute distress BP 140/90      Assessment & Plan:  Tobacco abuse increase in texture milligram twice a day followup in 6 weeks

## 2012-06-13 NOTE — Patient Instructions (Signed)
Slowly increase the Chantix.........Marland Kitchen 1 mg in the morning and . 5 mg at bedtime for 2 weeks then increase to a full milligram tablet twice daily  Taper as outlined  Return in 6 weeks for followup

## 2012-07-25 ENCOUNTER — Ambulatory Visit (INDEPENDENT_AMBULATORY_CARE_PROVIDER_SITE_OTHER): Payer: BC Managed Care – PPO | Admitting: Family Medicine

## 2012-07-25 ENCOUNTER — Encounter: Payer: Self-pay | Admitting: Family Medicine

## 2012-07-25 VITALS — BP 170/100 | Temp 98.5°F | Wt 183.0 lb

## 2012-07-25 DIAGNOSIS — I10 Essential (primary) hypertension: Secondary | ICD-10-CM

## 2012-07-25 DIAGNOSIS — F17200 Nicotine dependence, unspecified, uncomplicated: Secondary | ICD-10-CM

## 2012-07-25 MED ORDER — CHLORDIAZEPOXIDE HCL 10 MG PO CAPS: ORAL_CAPSULE | ORAL | Status: DC

## 2012-07-25 MED ORDER — VARENICLINE TARTRATE 1 MG PO TABS: ORAL_TABLET | ORAL | Status: DC

## 2012-07-25 MED ORDER — LISINOPRIL 40 MG PO TABS: ORAL_TABLET | ORAL | Status: DC

## 2012-07-25 NOTE — Progress Notes (Signed)
  Subjective:    Patient ID: Bobby Cuevas, male    DOB: 05-31-1950, 62 y.o.   MRN: [id number]  HPI Bobby Cuevas is a 62 year old male who comes in today for evaluation tobacco abuse and hypertension  He's on the Chantix program will 0.5 twice a day and is still smoking between 15 and 20 cigarettes a day. He has created no smoking sounds  No side effects to medication  His blood pressure has been under good control with Cardura 8 mg daily along with lisinopril 40 mg daily BP today 170/110. He's not been monitoring his blood pressure at home   Review of Systems    review of systems otherwise negative Objective:   Physical Exam Well-developed well-nourished male in no acute distress BP 170/100 right arm sitting position pulse 70 and regular  Ruddy complexion secondary to the tobacco abuse      Assessment & Plan:  Tobacco abuse for not making a lot of inroads will try another program  Hypertension increase lisinopril to 60 mg daily BP check daily followup in 2 weeks

## 2012-07-25 NOTE — Patient Instructions (Addendum)
Increase the lisinopril to 60 mg daily  Continue the Cardura 8 mg daily  Check a blood pressure in the morning daily  Return in 2 weeks for followup  Chantix,,,,,,,, 1 mg in the morning and 0.5 mg at bedtime  Taper as outlined  Librium 10 mg twice a day when necessary

## 2012-08-08 ENCOUNTER — Ambulatory Visit: Payer: BC Managed Care – PPO | Admitting: Family Medicine

## 2012-08-14 ENCOUNTER — Telehealth: Payer: Self-pay | Admitting: Family Medicine

## 2012-08-14 NOTE — Telephone Encounter (Signed)
Pt is calling to request a 3 month supply of amphetamine-dextroamphetamine (ADDERALL XR) 10 MG 24 hr capsule. Please assist.

## 2012-08-15 MED ORDER — AMPHETAMINE-DEXTROAMPHET ER 10 MG PO CP24: 10.0000 mg | ORAL_CAPSULE | Freq: Every day | ORAL | Status: DC

## 2012-08-15 NOTE — Addendum Note (Signed)
Addended by: Kern Reap B on: 08/15/2012 09:39 AM   Modules accepted: Orders

## 2012-08-15 NOTE — Addendum Note (Signed)
Addended by: Kern Reap B on: 08/15/2012 09:36 AM   Modules accepted: Orders

## 2012-08-15 NOTE — Telephone Encounter (Signed)
Wrong PCP printed

## 2012-08-15 NOTE — Telephone Encounter (Signed)
Rx ready for pick up and patient is aware 

## 2012-08-15 NOTE — Addendum Note (Signed)
Addended by: Kern Reap B on: 08/15/2012 09:34 AM   Modules accepted: Orders

## 2012-08-22 ENCOUNTER — Ambulatory Visit: Payer: BC Managed Care – PPO | Admitting: Family

## 2012-08-22 ENCOUNTER — Ambulatory Visit: Payer: BC Managed Care – PPO | Admitting: Family Medicine

## 2012-08-29 ENCOUNTER — Encounter: Payer: Self-pay | Admitting: Family

## 2012-08-29 ENCOUNTER — Ambulatory Visit (INDEPENDENT_AMBULATORY_CARE_PROVIDER_SITE_OTHER): Payer: BC Managed Care – PPO | Admitting: Family

## 2012-08-29 VITALS — BP 140/90 | HR 60 | Temp 98.5°F | Wt 181.0 lb

## 2012-08-29 DIAGNOSIS — I10 Essential (primary) hypertension: Secondary | ICD-10-CM

## 2012-08-29 DIAGNOSIS — F17200 Nicotine dependence, unspecified, uncomplicated: Secondary | ICD-10-CM

## 2012-08-29 DIAGNOSIS — F988 Other specified behavioral and emotional disorders with onset usually occurring in childhood and adolescence: Secondary | ICD-10-CM

## 2012-08-29 MED ORDER — VARENICLINE TARTRATE 1 MG PO TABS: 1.0000 mg | ORAL_TABLET | Freq: Two times a day (BID) | ORAL | Status: DC

## 2012-08-29 NOTE — Progress Notes (Signed)
Subjective:    Patient ID: Bobby Cuevas, male    DOB: 1950-10-15, 62 y.o.   MRN: [id number]  HPI 62 year old white male, smoker patient of Dr. is in today for recheck of hypertension. At his last office visit we increased his lisinopril to 40 mg once daily. He is also taking Cardura 8 mg. He is tolerating medication well. Blood pressure much improved. He seemed readings in the 130s systolically. Denies any lightheadedness, dizziness, chest pain, palpitations, shortness of breath edema.  Patient also has a history of tobacco abuse. He is been on Chantix to help. He is currently smoking 15-18 cigarettes daily. Will like a refill on Chantix.   Review of Systems  Constitutional: Negative.   HENT: Negative.   Respiratory: Negative.   Cardiovascular: Negative.   Gastrointestinal: Negative.   Endocrine: Negative.   Genitourinary: Negative.   Musculoskeletal: Negative.   Skin: Negative.   Neurological: Negative.   Psychiatric/Behavioral: Negative.    Past Medical History  Diagnosis Date  . Tobacco abuse   . Hyperlipidemia   . Hypertension     History   Social History  . Marital Status: Married    Spouse Name: N/A    Number of Children: N/A  . Years of Education: N/A   Occupational History  . Not on file.   Social History Main Topics  . Smoking status: Current Every Day Smoker -- 0.50 packs/day    Types: Cigarettes  . Smokeless tobacco: Not on file  . Alcohol Use: Yes  . Drug Use: No  . Sexual Activity: Not on file   Other Topics Concern  . Not on file   Social History Narrative  . No narrative on file    Past Surgical History  Procedure Laterality Date  . Appendectomy      Family History  Problem Relation Age of Onset  . Alcohol abuse Other   . Hypertension Other   . Cancer Other   . Lung disease Other     No Known Allergies  Current Outpatient Prescriptions on File Prior to Visit  Medication Sig Dispense Refill  . amphetamine-dextroamphetamine  (ADDERALL XR) 10 MG 24 hr capsule Take 1 capsule (10 mg total) by mouth daily.  30 capsule  0  . amphetamine-dextroamphetamine (ADDERALL XR) 10 MG 24 hr capsule Take 1 capsule (10 mg total) by mouth daily.  30 capsule  0  . amphetamine-dextroamphetamine (ADDERALL XR) 10 MG 24 hr capsule Take 1 capsule (10 mg total) by mouth daily.  30 capsule  0  . aspirin 81 MG tablet Take 81 mg by mouth daily.        . chlordiazePOXIDE (LIBRIUM) 10 MG capsule 1 by mouth twice a day  30 capsule  3  . doxazosin (CARDURA) 8 MG tablet TAKE 1 TABLET EVERY MORNING  90 tablet  3  . lisinopril (PRINIVIL,ZESTRIL) 40 MG tablet 1-1/2 tablets daily  150 tablet  3   No current facility-administered medications on file prior to visit.    BP 140/90  Pulse 60  Temp(Src) 98.5 F (36.9 C) (Oral)  Wt 181 lb (82.101 kg)  BMI 27.12 kg/m2chart    Objective:   Physical Exam  Constitutional: He is oriented to person, place, and time. He appears well-developed and well-nourished.  HENT:  Right Ear: External ear normal.  Left Ear: External ear normal.  Nose: Nose normal.  Mouth/Throat: Oropharynx is clear and moist.  Neck: Normal range of motion. Neck supple.  Cardiovascular: Normal rate, regular rhythm and  normal heart sounds.   138/82  Pulmonary/Chest: Effort normal and breath sounds normal.  Abdominal: Soft. Bowel sounds are normal.  Musculoskeletal: Normal range of motion.  Neurological: He is alert and oriented to person, place, and time. He has normal reflexes.  Skin: Skin is warm and dry.  Psychiatric: He has a normal mood and affect.          Assessment & Plan:  Assessment: 1. Hypertension 2. Tobacco abuse 3. Attention deficit disorder  Plan: Continue current medications. Encouraged healthy diet and exercise. Followup with patient in 3 months and sooner as needed.

## 2012-11-21 ENCOUNTER — Telehealth: Payer: Self-pay | Admitting: Family Medicine

## 2012-11-21 NOTE — Telephone Encounter (Signed)
Pt needs new rx generic adderall xr 10 mg #30. Pt needs 3 rxs

## 2012-11-25 ENCOUNTER — Ambulatory Visit (INDEPENDENT_AMBULATORY_CARE_PROVIDER_SITE_OTHER): Payer: BC Managed Care – PPO | Admitting: *Deleted

## 2012-11-25 DIAGNOSIS — Z23 Encounter for immunization: Secondary | ICD-10-CM

## 2012-11-25 MED ORDER — AMPHETAMINE-DEXTROAMPHET ER 10 MG PO CP24: 10.0000 mg | ORAL_CAPSULE | Freq: Every day | ORAL | Status: DC

## 2012-11-25 NOTE — Telephone Encounter (Signed)
Rx ready for pick up and Left message on machine for patient   

## 2012-12-11 ENCOUNTER — Other Ambulatory Visit: Payer: Self-pay | Admitting: Internal Medicine

## 2013-02-20 ENCOUNTER — Telehealth: Payer: Self-pay | Admitting: Family Medicine

## 2013-02-20 MED ORDER — AMPHETAMINE-DEXTROAMPHET ER 10 MG PO CP24: 10.0000 mg | ORAL_CAPSULE | Freq: Every day | ORAL | Status: DC

## 2013-02-20 NOTE — Telephone Encounter (Signed)
Pt needs new rx generic adderall xr 10 mg. Pt has about 7 pills left

## 2013-02-20 NOTE — Telephone Encounter (Signed)
Rx ready for pick up and patient is aware 

## 2013-03-16 ENCOUNTER — Other Ambulatory Visit: Payer: Self-pay | Admitting: Family Medicine

## 2013-05-29 ENCOUNTER — Telehealth: Payer: Self-pay | Admitting: Family Medicine

## 2013-05-29 NOTE — Telephone Encounter (Signed)
Pt req rx on amphetamine-dextroamphetamine (ADDERALL XR) 10 MG 24 hr capsule   [facility]

## 2013-06-02 MED ORDER — AMPHETAMINE-DEXTROAMPHET ER 10 MG PO CP24: 10.0000 mg | ORAL_CAPSULE | Freq: Every day | ORAL | Status: DC

## 2013-06-02 NOTE — Telephone Encounter (Signed)
Rx ready for pick up and patient is aware 

## 2013-07-16 ENCOUNTER — Other Ambulatory Visit: Payer: Self-pay | Admitting: Family Medicine

## 2013-09-04 ENCOUNTER — Telehealth: Payer: Self-pay | Admitting: Family Medicine

## 2013-09-04 MED ORDER — AMPHETAMINE-DEXTROAMPHET ER 10 MG PO CP24: 10.0000 mg | ORAL_CAPSULE | Freq: Every day | ORAL | Status: DC

## 2013-09-04 NOTE — Telephone Encounter (Signed)
rx ready for pick up and patient is aware. Patient needs an office visit for more refills.

## 2013-09-04 NOTE — Telephone Encounter (Signed)
Pt needs new rx  adderall xr 10 mg

## 2013-11-24 ENCOUNTER — Ambulatory Visit (INDEPENDENT_AMBULATORY_CARE_PROVIDER_SITE_OTHER): Payer: 59 | Admitting: Family Medicine

## 2013-11-24 ENCOUNTER — Encounter: Payer: Self-pay | Admitting: Family Medicine

## 2013-11-24 VITALS — BP 128/88 | Temp 98.4°F | Wt 172.7 lb

## 2013-11-24 DIAGNOSIS — Z720 Tobacco use: Secondary | ICD-10-CM

## 2013-11-24 DIAGNOSIS — I10 Essential (primary) hypertension: Secondary | ICD-10-CM

## 2013-11-24 DIAGNOSIS — F17200 Nicotine dependence, unspecified, uncomplicated: Secondary | ICD-10-CM

## 2013-11-24 DIAGNOSIS — Z23 Encounter for immunization: Secondary | ICD-10-CM

## 2013-11-24 DIAGNOSIS — F909 Attention-deficit hyperactivity disorder, unspecified type: Secondary | ICD-10-CM

## 2013-11-24 DIAGNOSIS — F988 Other specified behavioral and emotional disorders with onset usually occurring in childhood and adolescence: Secondary | ICD-10-CM

## 2013-11-24 LAB — BASIC METABOLIC PANEL
BUN: 16 mg/dL (ref 6–23)
CALCIUM: 9.1 mg/dL (ref 8.4–10.5)
CO2: 24 mEq/L (ref 19–32)
CREATININE: 0.9 mg/dL (ref 0.4–1.5)
Chloride: 110 mEq/L (ref 96–112)
GFR: 94.54 mL/min (ref >60.00)
Glucose, Bld: 93 mg/dL (ref 70–99)
Potassium: 5.2 mEq/L — ABNORMAL HIGH (ref 3.5–5.1)
Sodium: 146 mEq/L — ABNORMAL HIGH (ref 135–145)

## 2013-11-24 LAB — LIPID PANEL
Cholesterol: 173 mg/dL (ref 0–200)
HDL: 27.5 mg/dL — ABNORMAL LOW (ref >39.00)
NonHDL: 145.5
TRIGLYCERIDES: 215 mg/dL — AB (ref 0.0–149.0)
Total CHOL/HDL Ratio: 6
VLDL: 43 mg/dL — ABNORMAL HIGH (ref 0.0–40.0)

## 2013-11-24 LAB — CBC WITH DIFFERENTIAL/PLATELET
BASOS PCT: 0.4 % (ref 0.0–3.0)
Basophils Absolute: 0 10*3/uL (ref 0.0–0.1)
EOS PCT: 2.1 % (ref 0.0–5.0)
Eosinophils Absolute: 0.2 10*3/uL (ref 0.0–0.7)
HEMATOCRIT: 46.9 % (ref 39.0–52.0)
Hemoglobin: 15.4 g/dL (ref 13.0–17.0)
Lymphocytes Relative: 20 % (ref 12.0–46.0)
Lymphs Abs: 2.2 10*3/uL (ref 0.7–4.0)
MCHC: 32.8 g/dL (ref 30.0–36.0)
MCV: 99.3 fl (ref 78.0–100.0)
MONOS PCT: 5.8 % (ref 3.0–12.0)
Monocytes Absolute: 0.7 10*3/uL (ref 0.1–1.0)
Neutro Abs: 8.1 10*3/uL — ABNORMAL HIGH (ref 1.4–7.7)
Neutrophils Relative %: 71.7 % (ref 43.0–77.0)
PLATELETS: 128 10*3/uL — AB (ref 150.0–400.0)
RBC: 4.72 Mil/uL (ref 4.22–5.81)
RDW: 13.1 % (ref 11.5–15.5)
WBC: 11.2 10*3/uL — AB (ref 4.0–10.5)

## 2013-11-24 LAB — POCT URINALYSIS DIPSTICK
BILIRUBIN UA: NEGATIVE
Blood, UA: NEGATIVE
Glucose, UA: NEGATIVE
LEUKOCYTES UA: NEGATIVE
Nitrite, UA: NEGATIVE
Protein, UA: NEGATIVE
SPEC GRAV UA: 1.025
Urobilinogen, UA: 0.2
pH, UA: 5.5

## 2013-11-24 LAB — HEPATIC FUNCTION PANEL
ALT: 14 U/L (ref 0–53)
AST: 19 U/L (ref 0–37)
Albumin: 3.6 g/dL (ref 3.5–5.2)
Alkaline Phosphatase: 77 U/L (ref 39–117)
BILIRUBIN DIRECT: 0.1 mg/dL (ref 0.0–0.3)
Total Bilirubin: 0.5 mg/dL (ref 0.2–1.2)
Total Protein: 6.5 g/dL (ref 6.0–8.3)

## 2013-11-24 LAB — TSH: TSH: 1.12 u[IU]/mL (ref 0.35–4.50)

## 2013-11-24 LAB — LDL CHOLESTEROL, DIRECT: Direct LDL: 116 mg/dL

## 2013-11-24 LAB — PSA: PSA: 1.08 ng/mL (ref 0.10–4.00)

## 2013-11-24 MED ORDER — AMPHETAMINE-DEXTROAMPHET ER 10 MG PO CP24: 10.0000 mg | ORAL_CAPSULE | Freq: Every day | ORAL | Status: DC

## 2013-11-24 MED ORDER — AMPHETAMINE-DEXTROAMPHET ER 10 MG PO CP24
10.0000 mg | ORAL_CAPSULE | Freq: Every day | ORAL | Status: DC
Start: 2013-11-24 — End: 2014-03-23

## 2013-11-24 MED ORDER — VARENICLINE TARTRATE 1 MG PO TABS: ORAL_TABLET | ORAL | Status: DC

## 2013-11-24 MED ORDER — DOXAZOSIN MESYLATE 8 MG PO TABS: ORAL_TABLET | ORAL | Status: DC

## 2013-11-24 NOTE — Progress Notes (Signed)
Pre visit review using our clinic review tool, if applicable. No additional management support is needed unless otherwise documented below in the visit note. 

## 2013-11-24 NOTE — Progress Notes (Signed)
   Subjective:    Patient ID: Bobby Cuevas, male    DOB: [birth year], 63 y.o.   MRN: [id number]  HPI Bobby Cuevas is a 63 year old married male smoker..... One pack a day...Marland KitchenMarland KitchenMarland Kitchen Who comes in to get his medicines refilled  He was here last in the spring of 2015. He was due for physical examination but we never got back together.  He takes Adderall 10 mg daily for adult ADD, Cardura 8 mg a day at bedtime for hypertension BP 130/88  Deneise Lever is been office Chantix since June. On the Chantix he decrease his cigarette consumption from 20 cigarettes a day to 10   Review of Systems    review of systems otherwise negative Objective:   Physical Exam  Well-developed well-nourished male no acute distress he has extremely ruddy complexion secondary to chronic tobacco abuse      Assessment & Plan:  Adult ADD......... Continue Adderall  . Hypertension at goal....... Continue Cardura 8 mg daily  Tobacco abuse..........Marland Kitchen Restart Chantix  Return for CPX a small

## 2013-11-24 NOTE — Patient Instructions (Signed)
Adderall 10 mg........Marland Kitchen 1 daily  Cardura 8 mg....... One daily at bedtime  Chantix 1 mg........... One half tab twice daily for 2 weeks then increase to one tablet twice daily  Taper by 3 per week  Follow-up in one month.......... 30 minute appointment for physical examination  Labs today

## 2013-11-25 ENCOUNTER — Telehealth: Payer: Self-pay | Admitting: Family Medicine

## 2013-11-25 NOTE — Telephone Encounter (Signed)
emmi emailed °

## 2013-11-26 ENCOUNTER — Encounter: Payer: Self-pay | Admitting: Family Medicine

## 2013-11-26 ENCOUNTER — Telehealth: Payer: Self-pay | Admitting: Family Medicine

## 2013-11-26 MED ORDER — VARENICLINE TARTRATE 0.5 MG X 11 & 1 MG X 42 PO MISC: ORAL | Status: DC

## 2013-11-26 NOTE — Telephone Encounter (Signed)
PA denied for Chantix.  Pt must try and fail bupropion first.

## 2013-11-26 NOTE — Telephone Encounter (Signed)
Spoke with patient and he states that he has tried Wellbutrin in the past and had an allergic reaction - rash on feet.  A new Rx for Chantix was sent to pharmacy.  Please try another prior authorization if needed.

## 2013-11-27 NOTE — Telephone Encounter (Signed)
An Appeal has been submitted

## 2013-12-08 NOTE — Telephone Encounter (Signed)
Appeal has been approved. I called to advise the pharmacy and they will contact the patient.

## 2013-12-24 ENCOUNTER — Ambulatory Visit (INDEPENDENT_AMBULATORY_CARE_PROVIDER_SITE_OTHER): Payer: 59 | Admitting: Family Medicine

## 2013-12-24 ENCOUNTER — Encounter: Payer: Self-pay | Admitting: Family Medicine

## 2013-12-24 VITALS — BP 130/80 | Temp 98.4°F | Ht 68.5 in | Wt 172.4 lb

## 2013-12-24 DIAGNOSIS — F988 Other specified behavioral and emotional disorders with onset usually occurring in childhood and adolescence: Secondary | ICD-10-CM

## 2013-12-24 DIAGNOSIS — F17200 Nicotine dependence, unspecified, uncomplicated: Secondary | ICD-10-CM

## 2013-12-24 DIAGNOSIS — Z720 Tobacco use: Secondary | ICD-10-CM

## 2013-12-24 DIAGNOSIS — I10 Essential (primary) hypertension: Secondary | ICD-10-CM

## 2013-12-24 DIAGNOSIS — Z0000 Encounter for general adult medical examination without abnormal findings: Secondary | ICD-10-CM

## 2013-12-24 DIAGNOSIS — Z23 Encounter for immunization: Secondary | ICD-10-CM

## 2013-12-24 MED ORDER — VARENICLINE TARTRATE 1 MG PO TABS: ORAL_TABLET | ORAL | Status: DC

## 2013-12-24 NOTE — Progress Notes (Signed)
   Subjective:    Patient ID: Bobby Cuevas, male    DOB: [birth year], 63 y.o.   MRN: [id number]  HPI Bobby Cuevas is a 63 year old male smoker........ did not purchase the Chantix because it was too expensive?????... who comes in today for general physical examination  He has adult ADD and takes Adderall 10 mg long-acting daily  He also takes an aspirin and 8 mg Cardura pill for hypertension and BPH BP 130/80  Not taken the Chantix still smoking. Advised to go ahead and purchase the Chantix it's cheaper than cigarettes and death and lung cancer  He gets routine eye care, dental care, colonoscopy 2010 normal   Review of Systems  Constitutional: Negative.   HENT: Negative.   Eyes: Negative.   Respiratory: Negative.   Cardiovascular: Negative.   Gastrointestinal: Negative.   Endocrine: Negative.   Genitourinary: Negative.   Musculoskeletal: Negative.   Skin: Negative.   Allergic/Immunologic: Negative.   Neurological: Negative.   Hematological: Negative.   Psychiatric/Behavioral: Negative.        Objective:   Physical Exam  Constitutional: He is oriented to person, place, and time. He appears well-developed and well-nourished.  HENT:  Head: Normocephalic and atraumatic.  Right Ear: External ear normal.  Left Ear: External ear normal.  Nose: Nose normal.  Mouth/Throat: Oropharynx is clear and moist.  Eyes: Conjunctivae and EOM are normal. Pupils are equal, round, and reactive to light.  Neck: Normal range of motion. Neck supple. No JVD present. No tracheal deviation present. No thyromegaly present.  Cardiovascular: Normal rate, regular rhythm, normal heart sounds and intact distal pulses.  Exam reveals no gallop and no friction rub.   No murmur heard. No carotid bruits  Pulmonary/Chest: Effort normal and breath sounds normal. No stridor. No respiratory distress. He has no wheezes. He has no rales. He exhibits no tenderness.  Abdominal: Soft. Bowel sounds are normal. He  exhibits no distension and no mass. There is no tenderness. There is no rebound and no guarding.  Genitourinary: Rectum normal, prostate normal and penis normal. Guaiac negative stool. No penile tenderness.  Musculoskeletal: Normal range of motion. He exhibits no edema or tenderness.  Lymphadenopathy:    He has no cervical adenopathy.  Neurological: He is alert and oriented to person, place, and time. He has normal reflexes. No cranial nerve deficit. He exhibits normal muscle tone.  Skin: Skin is warm and dry. No rash noted. No erythema. No pallor.  Psychiatric: He has a normal mood and affect. His behavior is normal. Judgment and thought content normal.  Nursing note and vitals reviewed.         Assessment & Plan:  Healthy male  Adult ADD,,,, continue Adderall  Tobacco abuse,,,,,,,,,,, restart the Chantix program,,,,,,, taper nicotine as outlined,,,,,, follow-up in 2 months

## 2013-12-24 NOTE — Progress Notes (Signed)
Pre visit review using our clinic review tool, if applicable. No additional management support is needed unless otherwise documented below in the visit note. 

## 2013-12-24 NOTE — Patient Instructions (Signed)
Chantix 1 mg.......... one half tab daily in the morning for 3 weeks......... then one half tab twice daily  Begin to taper by 3 per week  Follow-up in 2 months

## 2013-12-25 ENCOUNTER — Telehealth: Payer: Self-pay | Admitting: *Deleted

## 2013-12-25 NOTE — Telephone Encounter (Signed)
Patient requests transferring to Dr Maudie Mercury.  Okay per Dr Sherren Mocha.

## 2013-12-25 NOTE — Telephone Encounter (Signed)
Pt has  Been sch

## 2013-12-25 NOTE — Telephone Encounter (Signed)
[place] with me. Schedule NPV with me. Thanks.

## 2014-02-10 ENCOUNTER — Encounter: Payer: 59 | Admitting: Family Medicine

## 2014-02-10 NOTE — Progress Notes (Signed)
error    This encounter was created in error - please disregard.

## 2014-02-24 ENCOUNTER — Ambulatory Visit: Payer: 59 | Admitting: Family Medicine

## 2014-03-20 ENCOUNTER — Other Ambulatory Visit: Payer: Self-pay | Admitting: Family Medicine

## 2014-03-20 NOTE — Telephone Encounter (Signed)
Pt request refill amphetamine-dextroamphetamine (ADDERALL XR) 10 MG 24 hr capsule  3 mo supply

## 2014-03-23 MED ORDER — AMPHETAMINE-DEXTROAMPHET ER 10 MG PO CP24: 10.0000 mg | ORAL_CAPSULE | Freq: Every day | ORAL | Status: DC

## 2014-03-23 NOTE — Telephone Encounter (Signed)
Ok per Dr Sherren Mocha, rx up front for p/u, pt aware

## 2014-04-16 ENCOUNTER — Telehealth: Payer: Self-pay | Admitting: Family Medicine

## 2014-04-16 MED ORDER — DOXAZOSIN MESYLATE 8 MG PO TABS: ORAL_TABLET | ORAL | Status: DC

## 2014-04-16 NOTE — Telephone Encounter (Signed)
Pt request refill of the following: doxazosin (CARDURA) 8 MG tablet   Phamacy: [facility] [place]

## 2014-04-16 NOTE — Telephone Encounter (Signed)
Rx sent 

## 2014-06-26 ENCOUNTER — Telehealth: Payer: Self-pay | Admitting: Family Medicine

## 2014-06-26 NOTE — Telephone Encounter (Signed)
° ° °

## 2014-06-29 MED ORDER — AMPHETAMINE-DEXTROAMPHET ER 10 MG PO CP24: 10.0000 mg | ORAL_CAPSULE | Freq: Every day | ORAL | Status: DC

## 2014-06-29 NOTE — Telephone Encounter (Signed)
Rx ready for pick up and left message for patient.

## 2014-09-25 ENCOUNTER — Telehealth: Payer: Self-pay | Admitting: *Deleted

## 2014-09-25 NOTE — Telephone Encounter (Signed)
Pt called and needs a refill of his adderall. Pt request a call back when RX is ready for pick up [phone] or (w) [phone]. Please advise. Thanks

## 2014-09-28 MED ORDER — AMPHETAMINE-DEXTROAMPHET ER 10 MG PO CP24: 10.0000 mg | ORAL_CAPSULE | Freq: Every day | ORAL | Status: DC

## 2014-09-29 NOTE — Telephone Encounter (Signed)
Rx ready for pick up and Left message on machine for patient   

## 2014-10-30 ENCOUNTER — Telehealth: Payer: Self-pay | Admitting: Family Medicine

## 2014-10-30 NOTE — Telephone Encounter (Signed)
Pharm has the adderall rx and would like to know if its ok to refill on today or 10-31-14. Pharm is close on Sunday. Pharm is aware md out of office until Monday 10/17-16

## 2014-11-02 NOTE — Telephone Encounter (Signed)
Spoke with pharmacist and okay early refill.

## 2014-12-22 ENCOUNTER — Telehealth: Payer: Self-pay | Admitting: Family Medicine

## 2014-12-22 ENCOUNTER — Other Ambulatory Visit (INDEPENDENT_AMBULATORY_CARE_PROVIDER_SITE_OTHER): Payer: 59

## 2014-12-22 ENCOUNTER — Other Ambulatory Visit: Payer: Self-pay | Admitting: Family Medicine

## 2014-12-22 DIAGNOSIS — Z0000 Encounter for general adult medical examination without abnormal findings: Secondary | ICD-10-CM | POA: Diagnosis not present

## 2014-12-22 LAB — HEPATIC FUNCTION PANEL
ALK PHOS: 79 U/L (ref 39–117)
ALT: 10 U/L (ref 0–53)
AST: 12 U/L (ref 0–37)
Albumin: 4.1 g/dL (ref 3.5–5.2)
BILIRUBIN DIRECT: 0.1 mg/dL (ref 0.0–0.3)
BILIRUBIN TOTAL: 0.8 mg/dL (ref 0.2–1.2)
Total Protein: 6.6 g/dL (ref 6.0–8.3)

## 2014-12-22 LAB — POCT URINALYSIS DIPSTICK
BILIRUBIN UA: NEGATIVE
Blood, UA: NEGATIVE
GLUCOSE UA: NEGATIVE
KETONES UA: NEGATIVE
Leukocytes, UA: NEGATIVE
Nitrite, UA: NEGATIVE
SPEC GRAV UA: 1.025
UROBILINOGEN UA: 0.2
pH, UA: 6

## 2014-12-22 LAB — LIPID PANEL
CHOL/HDL RATIO: 6
Cholesterol: 162 mg/dL (ref 0–200)
HDL: 27.7 mg/dL — AB (ref >39.00)
LDL CALC: 108 mg/dL — AB (ref 0–99)
NonHDL: 133.96
TRIGLYCERIDES: 131 mg/dL (ref 0.0–149.0)
VLDL: 26.2 mg/dL (ref 0.0–40.0)

## 2014-12-22 LAB — CBC WITH DIFFERENTIAL/PLATELET
BASOS ABS: 0 10*3/uL (ref 0.0–0.1)
BASOS PCT: 0.4 % (ref 0.0–3.0)
EOS ABS: 0.2 10*3/uL (ref 0.0–0.7)
Eosinophils Relative: 1.8 % (ref 0.0–5.0)
HCT: 45.6 % (ref 39.0–52.0)
Hemoglobin: 15.5 g/dL (ref 13.0–17.0)
Lymphocytes Relative: 17.8 % (ref 12.0–46.0)
Lymphs Abs: 2 10*3/uL (ref 0.7–4.0)
MCHC: 33.9 g/dL (ref 30.0–36.0)
MCV: 98.1 fl (ref 78.0–100.0)
MONO ABS: 0.9 10*3/uL (ref 0.1–1.0)
Monocytes Relative: 7.9 % (ref 3.0–12.0)
NEUTROS ABS: 8 10*3/uL — AB (ref 1.4–7.7)
NEUTROS PCT: 72.1 % (ref 43.0–77.0)
PLATELETS: 122 10*3/uL — AB (ref 150.0–400.0)
RBC: 4.65 Mil/uL (ref 4.22–5.81)
RDW: 13 % (ref 11.5–15.5)
WBC: 11.1 10*3/uL — ABNORMAL HIGH (ref 4.0–10.5)

## 2014-12-22 LAB — BASIC METABOLIC PANEL
BUN: 14 mg/dL (ref 6–23)
CALCIUM: 8.9 mg/dL (ref 8.4–10.5)
CHLORIDE: 107 meq/L (ref 96–112)
CO2: 29 meq/L (ref 19–32)
CREATININE: 0.88 mg/dL (ref 0.40–1.50)
GFR: 92.97 mL/min (ref >60.00)
Glucose, Bld: 85 mg/dL (ref 70–99)
Potassium: 4.6 mEq/L (ref 3.5–5.1)
Sodium: 144 mEq/L (ref 135–145)

## 2014-12-22 LAB — TSH: TSH: 1.85 u[IU]/mL (ref 0.35–4.50)

## 2014-12-22 LAB — PSA: PSA: 0.83 ng/mL (ref 0.10–4.00)

## 2014-12-22 NOTE — Telephone Encounter (Signed)
Patient states he is out of his medication and needs a refill.  Medication is Doxazosin Mesylate 8 mg tab.  [facility] in [place]

## 2014-12-22 NOTE — Telephone Encounter (Signed)
Rx was sent  

## 2014-12-28 ENCOUNTER — Encounter: Payer: Self-pay | Admitting: Family Medicine

## 2014-12-28 ENCOUNTER — Ambulatory Visit (INDEPENDENT_AMBULATORY_CARE_PROVIDER_SITE_OTHER): Payer: 59 | Admitting: Family Medicine

## 2014-12-28 VITALS — Temp 98.6°F | Ht 68.0 in | Wt 176.0 lb

## 2014-12-28 DIAGNOSIS — Z23 Encounter for immunization: Secondary | ICD-10-CM

## 2014-12-28 DIAGNOSIS — Z0000 Encounter for general adult medical examination without abnormal findings: Secondary | ICD-10-CM

## 2014-12-28 DIAGNOSIS — F988 Other specified behavioral and emotional disorders with onset usually occurring in childhood and adolescence: Secondary | ICD-10-CM

## 2014-12-28 DIAGNOSIS — F17200 Nicotine dependence, unspecified, uncomplicated: Secondary | ICD-10-CM

## 2014-12-28 DIAGNOSIS — I10 Essential (primary) hypertension: Secondary | ICD-10-CM | POA: Diagnosis not present

## 2014-12-28 MED ORDER — LOSARTAN POTASSIUM 25 MG PO TABS: 25.0000 mg | ORAL_TABLET | Freq: Every day | ORAL | Status: DC

## 2014-12-28 MED ORDER — VARENICLINE TARTRATE 1 MG PO TABS: ORAL_TABLET | ORAL | Status: DC

## 2014-12-28 MED ORDER — DOXAZOSIN MESYLATE 8 MG PO TABS: 8.0000 mg | ORAL_TABLET | Freq: Every morning | ORAL | Status: DC

## 2014-12-28 MED ORDER — AMPHETAMINE-DEXTROAMPHET ER 5 MG PO CP24: 5.0000 mg | ORAL_CAPSULE | Freq: Every day | ORAL | Status: DC

## 2014-12-28 NOTE — Patient Instructions (Signed)
Continue the Cardura 8 mg daily  Losartan 25 mg,,,,,,,,, 1 daily in the morning  Check your blood pressure daily in the morning  Return in one month for follow-up with a record of all your blood pressure readings and the device,,,,,,,, Omron pump up digital blood pressure cuff  Begin the Chantix program,,,,,,,,,, 1 mg,,,,,,,,,, one half tab daily for 4 days,,,,,,,, then one tab daily  Taper the nicotine,,,,,,,,, by 5 per week,,,,,,,,, 15,,,,,,,,,,, 10,,,,,,, 5,,,,,,,,,,, 0  Call Dr. Johnn Hai group........... asked to be seen for evaluation  Decrease the Adderall to 5 mg daily

## 2014-12-28 NOTE — Progress Notes (Signed)
Pre visit review using our clinic review tool, if applicable. No additional management support is needed unless otherwise documented below in the visit note. 

## 2014-12-28 NOTE — Progress Notes (Signed)
   Subjective:    Patient ID: Bobby Cuevas, male    DOB: 11/01/1950, 64 y.o.   MRN: [id number]  HPI Dason is a 64 year old married male smoker,,,,,,,, one pack-a-day for many many years,,,,, who comes in today accompanied by his wife for general physical examination  Her concerns that his activity level is up her the medication doesn't seem to be working and he continues to smoke 2 months. She sees Dr. Caprice Beaver. I recommend that he see her also for evaluation  He gets routine eye care, dental care, colonoscopy 2010  Vaccinations up-to-date  We gave him prescription for Chantix but he didn't get it filled because it was too expensive!!!!!!!!!!   Review of Systems  Constitutional: Negative.   HENT: Negative.   Eyes: Negative.   Respiratory: Negative.   Cardiovascular: Negative.   Gastrointestinal: Negative.   Endocrine: Negative.   Genitourinary: Negative.   Musculoskeletal: Negative.   Skin: Negative.   Allergic/Immunologic: Negative.   Neurological: Negative.   Hematological: Negative.   Psychiatric/Behavioral: Negative.        Objective:   Physical Exam  Constitutional: He is oriented to person, place, and time. He appears well-developed and well-nourished.  HENT:  Head: Normocephalic and atraumatic.  Right Ear: External ear normal.  Left Ear: External ear normal.  Nose: Nose normal.  Mouth/Throat: Oropharynx is clear and moist.  Eyes: Conjunctivae and EOM are normal. Pupils are equal, round, and reactive to light.  Neck: Normal range of motion. Neck supple. No JVD present. No tracheal deviation present. No thyromegaly present.  Cardiovascular: Normal rate, regular rhythm, normal heart sounds and intact distal pulses.  Exam reveals no gallop and no friction rub.   No murmur heard. No carotid nor aortic bruits peripheral pulses 1+ and symmetrical  Pulmonary/Chest: Effort normal and breath sounds normal. No stridor. No respiratory distress. He has no wheezes. He  has no rales. He exhibits no tenderness.  Abdominal: Soft. Bowel sounds are normal. He exhibits no distension and no mass. There is no tenderness. There is no rebound and no guarding.  Genitourinary: Rectum normal, prostate normal and penis normal. Guaiac negative stool. No penile tenderness.  Musculoskeletal: Normal range of motion. He exhibits no edema or tenderness.  Lymphadenopathy:    He has no cervical adenopathy.  Neurological: He is alert and oriented to person, place, and time. He has normal reflexes. No cranial nerve deficit. He exhibits normal muscle tone.  Skin: Skin is warm and dry. No rash noted. No erythema. No pallor.  Extremely ruddy complexion from chronic tobacco abuse  Psychiatric: He has a normal mood and affect. His behavior is normal. Judgment and thought content normal.  Nursing note and vitals reviewed.         Assessment & Plan:  Hypertension not at goal,,,,,,,,,,,, continue Cardura,,,,,,, add losartan,,,,,,,, BP check daily follow-up 1 month  Tobacco abuse,,,,,,,,,,,,, Chantix program as outlined  Adult ADD,,,,,,,,, decrease Adderall to 5 mg daily,,,,,,,,,,,,, evaluation with Dr. Caprice Beaver in group

## 2015-01-07 ENCOUNTER — Telehealth: Payer: Self-pay | Admitting: Family Medicine

## 2015-01-07 NOTE — Telephone Encounter (Signed)
PA for Chantix was denied. Pt must try and fail bupropion.

## 2015-01-15 NOTE — Telephone Encounter (Signed)
Spoke with patient and he is changing insurance 2017.  Patient states he tried wellbutrin 2005-2006 and it caused rash on his feet.  I will send in a new prescription for Chantix next week.  Wellbutrin is on his allergie list.

## 2015-01-19 MED ORDER — VARENICLINE TARTRATE 1 MG PO TABS: ORAL_TABLET | ORAL | Status: DC

## 2015-01-19 NOTE — Telephone Encounter (Signed)
New Rx sent.

## 2015-01-26 ENCOUNTER — Ambulatory Visit (INDEPENDENT_AMBULATORY_CARE_PROVIDER_SITE_OTHER): Payer: BLUE CROSS/BLUE SHIELD | Admitting: Family Medicine

## 2015-02-08 ENCOUNTER — Encounter: Payer: Self-pay | Admitting: Family Medicine

## 2015-02-08 ENCOUNTER — Ambulatory Visit (INDEPENDENT_AMBULATORY_CARE_PROVIDER_SITE_OTHER): Payer: BLUE CROSS/BLUE SHIELD | Admitting: Family Medicine

## 2015-02-08 VITALS — Temp 98.3°F | Wt 178.0 lb

## 2015-02-08 DIAGNOSIS — F17200 Nicotine dependence, unspecified, uncomplicated: Secondary | ICD-10-CM | POA: Diagnosis not present

## 2015-02-08 DIAGNOSIS — F988 Other specified behavioral and emotional disorders with onset usually occurring in childhood and adolescence: Secondary | ICD-10-CM

## 2015-02-08 DIAGNOSIS — I10 Essential (primary) hypertension: Secondary | ICD-10-CM

## 2015-02-08 DIAGNOSIS — F909 Attention-deficit hyperactivity disorder, unspecified type: Secondary | ICD-10-CM | POA: Diagnosis not present

## 2015-02-08 MED ORDER — LOSARTAN POTASSIUM 50 MG PO TABS: 50.0000 mg | ORAL_TABLET | Freq: Every day | ORAL | Status: DC

## 2015-02-08 MED ORDER — AMPHETAMINE-DEXTROAMPHET ER 5 MG PO CP24: 5.0000 mg | ORAL_CAPSULE | Freq: Every day | ORAL | Status: DC

## 2015-02-08 MED ORDER — VARENICLINE TARTRATE 0.5 MG PO TABS: ORAL_TABLET | ORAL | Status: DC

## 2015-02-08 NOTE — Progress Notes (Signed)
   Subjective:    Patient ID: Bobby Cuevas, male    DOB: 07-01-1950, 65 y.o.   MRN: [id number]  HPI [name] is a 65 year old married male smoker....... one pack of cigarettes a day....... who comes in today for follow-up of multiple issues  He's been on Cardura 8 mg daily for hypertension for many years. Recently his blood pressure went up. We added Cozaar 25 mg daily and it hasn't really affected his blood pressure. BP today 170/90  History chronic wrist has him on Zoloft 50 mg a day for anxiety. I also gave him prescription for the Chantix which she did not get filled. I again stressed the importance of her not smoking. I explained to him the mechanism of hypertension. It may be that he has a blockage of his renal artery from the chronic tobacco abuse. I will request a cardiac consult for evaluation of the hypertension and question renal artery stenosis. Also one to do this to reinforce that he really needs to do what he supposed to do. Sometimes especially consult we'll get their attention.   Review of Systems Review of systems otherwise negative    Objective:   Physical Exam  Well-developed well-nourished male no acute distress vital signs stable he is afebrile except for BP 170/90      Assessment & Plan:  Hypertension not at goal......... increase Cozaar to 50 mg daily........ BP check every morning......... cardiac evaluation  Tobacco abuse.......... Chantix 0.5 daily........ taper by 5 per week  Adult ADD.........Marland Kitchen may require coming off the Adderall to normalize his blood pressure.......Marland Kitchen we will ask cardiology to give [place] their opinion on that issue

## 2015-02-08 NOTE — Patient Instructions (Addendum)
Chantix 0.5......... one tablet daily in the morning  Taper by 5 cigarettes per week........... 15...........Marland Kitchen 10........... 5........4,,,,,,, 3,,,,,,,, 2,,,,,1,,,,,,,,,, quit  We will set up a cardiology consult to help [place] evaluate your blood pressure  Increase the Cozaar to 50 mg daily  When you see the cardiologist take a record of all your blood pressure readings and the device with you to that appointment  Set up a follow-up appointment with one of the new folks after you see the cardiologist,,,,,,,,,, Marcelle Smiling,,,,,,, are 2 new adult nurse practitioner's or Dr. Martinique

## 2015-02-08 NOTE — Progress Notes (Signed)
Pre visit review using our clinic review tool, if applicable. No additional management support is needed unless otherwise documented below in the visit note. 

## 2015-03-04 ENCOUNTER — Encounter: Payer: Self-pay | Admitting: Internal Medicine

## 2015-03-12 ENCOUNTER — Ambulatory Visit (INDEPENDENT_AMBULATORY_CARE_PROVIDER_SITE_OTHER): Payer: BLUE CROSS/BLUE SHIELD | Admitting: Internal Medicine

## 2015-03-12 ENCOUNTER — Encounter: Payer: Self-pay | Admitting: Internal Medicine

## 2015-03-12 VITALS — BP 132/68 | HR 72 | Ht 68.0 in | Wt 177.8 lb

## 2015-03-12 DIAGNOSIS — E785 Hyperlipidemia, unspecified: Secondary | ICD-10-CM | POA: Diagnosis not present

## 2015-03-12 DIAGNOSIS — I10 Essential (primary) hypertension: Secondary | ICD-10-CM

## 2015-03-12 MED ORDER — HYDROCHLOROTHIAZIDE 12.5 MG PO CAPS: 12.5000 mg | ORAL_CAPSULE | Freq: Every day | ORAL | Status: DC

## 2015-03-12 NOTE — Progress Notes (Signed)
Cardiology Office Note   Date:  03/12/2015   ID:  Bobby Cuevas, DOB April 17, 1950, MRN [id number]  PCP:  Joycelyn Man, MD  Cardiologist:   Dorris Carnes, MD   Pt rferred by Rhunette Croft for BP elevation     History of Present Illness: Bobby Cuevas is a 65 y.o. male with a history of HTN, tobacco use  He is followed by Rhunette Croft  BP has been elevated  Pt dneies CP  Breathing is OK  He is not that active    No dizziness Continues to smoke 1 ppd  Has tried to quit in past       Outpatient Prescriptions Prior to Visit  Medication Sig Dispense Refill  . amphetamine-dextroamphetamine (ADDERALL XR) 5 MG 24 hr capsule Take 1 capsule (5 mg total) by mouth daily. 30 capsule 0  . aspirin 81 MG tablet Take 81 mg by mouth daily.      Marland Kitchen doxazosin (CARDURA) 8 MG tablet Take 1 tablet (8 mg total) by mouth every morning. 100 tablet 3  . losartan (COZAAR) 50 MG tablet Take 1 tablet (50 mg total) by mouth daily. 90 tablet 3  . sertraline (ZOLOFT) 50 MG tablet Take 50 mg by mouth daily.    Marland Kitchen amphetamine-dextroamphetamine (ADDERALL XR) 5 MG 24 hr capsule Take 1 capsule (5 mg total) by mouth daily. (Patient not taking: Reported on 03/12/2015) 30 capsule 0  . amphetamine-dextroamphetamine (ADDERALL XR) 5 MG 24 hr capsule Take 1 capsule (5 mg total) by mouth daily. (Patient not taking: Reported on 03/12/2015) 30 capsule 0  . varenicline (CHANTIX PAK) 0.5 MG X 11 & 1 MG X 42 tablet Take one 0.5 mg tablet by mouth once daily for 3 days, then increase to one 0.5 mg tablet twice daily for 4 days, then increase to one 1 mg tablet twice daily. (Patient not taking: Reported on 03/12/2015) 53 tablet 0  . varenicline (CHANTIX) 0.5 MG tablet 1 tablet daily in the morning (Patient not taking: Reported on 03/12/2015) 60 tablet 3  . varenicline (CHANTIX) 1 MG tablet TAKE 1 TABLET BY MOUTH TWICE A DAY (Patient not taking: Reported on 03/12/2015) 60 tablet 10   No facility-administered medications prior to visit.      Allergies:   Wellbutrin   Past Medical History  Diagnosis Date  . Tobacco abuse   . Hyperlipidemia   . Hypertension     Past Surgical History  Procedure Laterality Date  . Appendectomy       Social History:  The patient  reports that he has been smoking Cigarettes.  He has been smoking about 0.50 packs per day. He does not have any smokeless tobacco history on file. He reports that he drinks alcohol. He reports that he does not use illicit drugs.   Family History:  The patient's family history includes Alcohol abuse in his other; Cancer in his other; Hypertension in his other; Lung disease in his other.    ROS:  Please see the history of present illness. All other systems are reviewed and  Negative to the above problem except as noted.    PHYSICAL EXAM: VS:  BP 132/68 mmHg  Pulse 72  Ht 5\' 8"  (1.727 m)  Wt 177 lb 12.8 oz (80.65 kg)  BMI 27.04 kg/m2  SpO2 98%  GEN: Well nourished, well developed, in no acute distress HEENT: normal Neck: no JVD, carotid bruits, or masses Cardiac: RRR; no murmurs, rubs, or gallops,no edema  Respiratory:  clear to auscultation bilaterally, normal work of breathing GI: soft, nontender, nondistended, + BS  No hepatomegaly  MS: no deformity Moving all extremities   Skin: warm and dry, no rash Neuro:  Strength and sensation are intact Psych: euthymic mood, full affect   EKG:  EKG is ordered today  On 12/12 SB 57 bpm     Lipid Panel    Component Value Date/Time   CHOL 162 12/22/2014 0906   TRIG 131.0 12/22/2014 0906   HDL 27.70* 12/22/2014 0906   CHOLHDL 6 12/22/2014 0906   VLDL 26.2 12/22/2014 0906   LDLCALC 108* 12/22/2014 0906   LDLDIRECT 116.0 11/24/2013 1009      Wt Readings from Last 3 Encounters:  03/12/15 177 lb 12.8 oz (80.65 kg)  02/08/15 178 lb (80.74 kg)  12/28/14 176 lb (79.833 kg)      ASSESSMENT AND PLAN:  1  HTN  BP is not optimally controlled  On My check is 160/90  I would recomm adding HCTZ to  regimen Continue other meds   12.5 mg  F/U in 4 weeks  At this pt I would not pursue RAS unless BP remains high   2  HCM  I would recomm a vascuscreen to evla for plaquing and guide Rx of lipids   Pt counselled on tobacco use  Encouraged to quit    F/U base on test results      Current medicines are reviewed at length with the patient today.  The patient does not have concerns regarding medicines.  The following changes have been made:   Labs/ tests ordered today include: No orders of the defined types were placed in this encounter.     Disposition:   FU with  in   Signed, Dorris Carnes, MD  03/12/2015 3:35 PM    [facility] [place], [place], [place]  [address] Phone: [phone]; Fax: [phone]

## 2015-03-12 NOTE — Patient Instructions (Signed)
Your physician has recommended you make the following change in your medication:  1.) START hydrochlorothiazide (HCTZ) 12.5 mg ONCE A DAY  Your physician recommends that you schedule a follow-up appointment in: [place] DR. ROSS  YOU CAN SCHEDULE A VASCU-SCREEN--TO LOOK AT YOUR CAROTID ARTERIES AND YOUR AORTA.   THE COST IS OUT OF POCKET-

## 2015-03-18 ENCOUNTER — Ambulatory Visit (HOSPITAL_COMMUNITY)
Admission: RE | Admit: 2015-03-18 | Discharge: 2015-03-18 | Disposition: A | Discharge status: Home / Self Care | Payer: BLUE CROSS/BLUE SHIELD | Source: Ambulatory Visit | Attending: Cardiology | Admitting: Cardiology

## 2015-03-18 DIAGNOSIS — I10 Essential (primary) hypertension: Secondary | ICD-10-CM

## 2015-03-18 DIAGNOSIS — E785 Hyperlipidemia, unspecified: Secondary | ICD-10-CM

## 2015-04-16 ENCOUNTER — Ambulatory Visit (INDEPENDENT_AMBULATORY_CARE_PROVIDER_SITE_OTHER): Payer: BLUE CROSS/BLUE SHIELD | Admitting: Internal Medicine

## 2015-04-16 ENCOUNTER — Encounter: Payer: Self-pay | Admitting: Internal Medicine

## 2015-04-16 ENCOUNTER — Telehealth: Payer: Self-pay | Admitting: *Deleted

## 2015-04-16 VITALS — BP 138/74 | HR 59 | Ht 67.0 in | Wt 179.1 lb

## 2015-04-16 DIAGNOSIS — I10 Essential (primary) hypertension: Secondary | ICD-10-CM

## 2015-04-16 LAB — BASIC METABOLIC PANEL
BUN: 17 mg/dL (ref 7–25)
CHLORIDE: 103 mmol/L (ref 98–110)
CO2: 28 mmol/L (ref 20–31)
Calcium: 8.6 mg/dL (ref 8.6–10.3)
Creat: 0.84 mg/dL (ref 0.70–1.25)
Glucose, Bld: 76 mg/dL (ref 65–99)
Potassium: 4.1 mmol/L (ref 3.5–5.3)
SODIUM: 139 mmol/L (ref 135–146)

## 2015-04-16 NOTE — Patient Instructions (Signed)
**Note De-Identified Jema Deegan Obfuscation** Medication Instructions:  Same-no changes  Labwork: BMET today  Testing/Procedures: None  Follow-Up: Your physician wants you to follow-up in: the spring of 2018. You will receive a reminder letter in the mail two months in advance. If you don't receive a letter, please call our office to schedule the follow-up appointment.     If you need a refill on your cardiac medications before your next appointment, please call your pharmacy.

## 2015-04-16 NOTE — Progress Notes (Signed)
Cardiology Office Note   Date:  04/16/2015   ID:  Bobby Cuevas, DOB 1950/09/22, MRN [id number]  PCP:  Joycelyn Man, MD  Cardiologist:   Dorris Carnes, MD   F/U of HTN   History of Present Illness: Bobby Cuevas is a 65 y.o. male with a history of HTN, tobacco use  I saw him in Feb   When I saw him his bp was high  I added HCTZ to regimen  Plan for /u in 4 wks    Also sched for vascuscreen  Carotids and aorta were normal    Since HCTZ was added  Feels good  No dizziness  BP 120s to 130s     Pulse is slow 50s    Outpatient Prescriptions Prior to Visit  Medication Sig Dispense Refill  . amphetamine-dextroamphetamine (ADDERALL XR) 5 MG 24 hr capsule Take 1 capsule (5 mg total) by mouth daily. 30 capsule 0  . aspirin 81 MG tablet Take 81 mg by mouth daily.      Marland Kitchen doxazosin (CARDURA) 8 MG tablet Take 1 tablet (8 mg total) by mouth every morning. 100 tablet 3  . hydrochlorothiazide (MICROZIDE) 12.5 MG capsule Take 1 capsule (12.5 mg total) by mouth daily. 90 capsule 3  . losartan (COZAAR) 50 MG tablet Take 1 tablet (50 mg total) by mouth daily. 90 tablet 3  . sertraline (ZOLOFT) 50 MG tablet Take 50 mg by mouth daily.     No facility-administered medications prior to visit.     Allergies:   Wellbutrin   Past Medical History  Diagnosis Date  . Tobacco abuse   . Hyperlipidemia   . Hypertension     Past Surgical History  Procedure Laterality Date  . Appendectomy       Social History:  The patient  reports that he has been smoking Cigarettes.  He has been smoking about 0.50 packs per day. He has never used smokeless tobacco. He reports that he drinks alcohol. He reports that he does not use illicit drugs.   Family History:  The patient's family history includes Alcohol abuse in his other; Cancer in his other; Hypertension in his other; Lung disease in his other.    ROS:  Please see the history of present illness. All other systems are reviewed and  Negative  to the above problem except as noted.    PHYSICAL EXAM: VS:  BP 138/74 mmHg  Pulse 59  Ht 5\' 7"  (1.702 m)  Wt 179 lb 1.9 oz (81.248 kg)  BMI 28.05 kg/m2  GEN: Well nourished, well developed, in no acute distress HEENT: normal Neck: no JVD, carotid bruits, or masses Cardiac: RRR; no murmurs, rubs, or gallops,no edema  Respiratory:  clear to auscultation bilaterally, normal work of breathing GI: soft, nontender, nondistended, + BS  No hepatomegaly  MS: no deformity Moving all extremities   Skin: warm and dry, no rash Neuro:  Strength and sensation are intact Psych: euthymic mood, full affect   EKG:  EKG is not ordered today.   Lipid Panel    Component Value Date/Time   CHOL 162 12/22/2014 0906   TRIG 131.0 12/22/2014 0906   HDL 27.70* 12/22/2014 0906   CHOLHDL 6 12/22/2014 0906   VLDL 26.2 12/22/2014 0906   LDLCALC 108* 12/22/2014 0906   LDLDIRECT 116.0 11/24/2013 1009      Wt Readings from Last 3 Encounters:  04/16/15 179 lb 1.9 oz (81.248 kg)  03/12/15 177 lb 12.8 oz (80.65 kg)  02/08/15 178 lb (80.74 kg)      ASSESSMENT AND PLAN: 1  HTN  BP is well controlled  Check BMET  2.  Lipids  HDL is a little low at 30  LDL pretty good at 108  Exercise  Diet  3  Tob  Counselled on quitting   I will see pt in Spring 2018     Signed, Dorris Carnes, MD  04/16/2015 11:09 AM    [facility] [address], [place], [place]  [address] Phone: [phone]; Fax: [phone]

## 2015-04-16 NOTE — Telephone Encounter (Signed)
Pt notified of lab results by phone with verbal understanding.  

## 2015-05-05 ENCOUNTER — Encounter: Payer: Self-pay | Admitting: Gastroenterology

## 2015-05-11 ENCOUNTER — Telehealth: Payer: Self-pay | Admitting: Family Medicine

## 2015-05-11 MED ORDER — AMPHETAMINE-DEXTROAMPHET ER 5 MG PO CP24: 5.0000 mg | ORAL_CAPSULE | Freq: Every day | ORAL | Status: DC

## 2015-05-11 NOTE — Telephone Encounter (Signed)
Pt needs new rx generic adderall xr 5 mg  °

## 2015-05-11 NOTE — Telephone Encounter (Signed)
rx ready for pick up and patient is aware  

## 2015-08-23 ENCOUNTER — Other Ambulatory Visit: Payer: Self-pay | Admitting: Emergency Medicine

## 2015-08-23 ENCOUNTER — Telehealth: Payer: Self-pay | Admitting: Family Medicine

## 2015-08-23 MED ORDER — AMPHETAMINE-DEXTROAMPHET ER 5 MG PO CP24: 5.0000 mg | ORAL_CAPSULE | Freq: Every day | ORAL | 0 refills | Status: DC

## 2015-08-23 NOTE — Telephone Encounter (Signed)
Pt request refill  amphetamine-dextroamphetamine (ADDERALL XR) 5 MG 24 hr capsule  3 mo supply

## 2015-08-23 NOTE — Telephone Encounter (Signed)
Dr. Sherren Mocha Rx request

## 2015-08-25 NOTE — Telephone Encounter (Signed)
Called pt and informed him prescriptions are printed and waiting on Dr.Todd's signature. Will call when ready for pick-up. Pt veralized understanding.

## 2015-08-25 NOTE — Telephone Encounter (Signed)
Called and left message prescription is up front ready for pick up.

## 2015-11-11 ENCOUNTER — Other Ambulatory Visit: Payer: Self-pay | Admitting: Family Medicine

## 2015-11-26 ENCOUNTER — Telehealth: Payer: Self-pay | Admitting: Family Medicine

## 2015-11-26 NOTE — Telephone Encounter (Signed)
Pt needs new rx generic adderall xr 5 mg

## 2015-11-26 NOTE — Telephone Encounter (Signed)
Pt is aware.  

## 2015-11-26 NOTE — Telephone Encounter (Signed)
Called and left voicemail for pt to return call to office.  Please inform pt that Dr. Sherren Mocha is out of the office and will be return Monday

## 2015-11-30 NOTE — Telephone Encounter (Signed)
Pt following up on refill request. Would like to pick up today/tomorrow

## 2015-12-01 ENCOUNTER — Other Ambulatory Visit: Payer: Self-pay | Admitting: Emergency Medicine

## 2015-12-01 MED ORDER — AMPHETAMINE-DEXTROAMPHET ER 5 MG PO CP24
5.0000 mg | ORAL_CAPSULE | Freq: Every day | ORAL | 0 refills | Status: DC
Start: 2015-12-01 — End: 2016-06-14

## 2015-12-01 MED ORDER — AMPHETAMINE-DEXTROAMPHET ER 5 MG PO CP24: 5.0000 mg | ORAL_CAPSULE | Freq: Every day | ORAL | 0 refills | Status: DC

## 2015-12-01 NOTE — Telephone Encounter (Signed)
Called and spoke with pt informing him that prescription has been printed and placed up front for pick up. Also explained that appointment is needed for further refills. Per Dr.Todd establishing with a new PCP would be best. It verbalized understanding nothing further needed at this time.

## 2015-12-22 ENCOUNTER — Other Ambulatory Visit: Payer: Self-pay | Admitting: Internal Medicine

## 2015-12-22 DIAGNOSIS — I10 Essential (primary) hypertension: Secondary | ICD-10-CM

## 2015-12-22 DIAGNOSIS — E785 Hyperlipidemia, unspecified: Secondary | ICD-10-CM

## 2015-12-23 ENCOUNTER — Ambulatory Visit (INDEPENDENT_AMBULATORY_CARE_PROVIDER_SITE_OTHER): Payer: BLUE CROSS/BLUE SHIELD

## 2015-12-23 DIAGNOSIS — Z23 Encounter for immunization: Secondary | ICD-10-CM

## 2016-01-15 ENCOUNTER — Other Ambulatory Visit: Payer: Self-pay | Admitting: Family Medicine

## 2016-01-18 ENCOUNTER — Other Ambulatory Visit: Payer: BLUE CROSS/BLUE SHIELD

## 2016-01-19 ENCOUNTER — Other Ambulatory Visit (INDEPENDENT_AMBULATORY_CARE_PROVIDER_SITE_OTHER): Payer: BLUE CROSS/BLUE SHIELD

## 2016-01-19 DIAGNOSIS — Z0000 Encounter for general adult medical examination without abnormal findings: Secondary | ICD-10-CM

## 2016-01-19 LAB — CBC WITH DIFFERENTIAL/PLATELET
BASOS ABS: 0 10*3/uL (ref 0.0–0.1)
Basophils Relative: 0.3 % (ref 0.0–3.0)
Eosinophils Absolute: 0.1 10*3/uL (ref 0.0–0.7)
Eosinophils Relative: 1.4 % (ref 0.0–5.0)
HEMATOCRIT: 40.7 % (ref 39.0–52.0)
HEMOGLOBIN: 14 g/dL (ref 13.0–17.0)
LYMPHS PCT: 27.5 % (ref 12.0–46.0)
Lymphs Abs: 1.9 10*3/uL (ref 0.7–4.0)
MCHC: 34.5 g/dL (ref 30.0–36.0)
MCV: 97 fl (ref 78.0–100.0)
Monocytes Absolute: 0.7 10*3/uL (ref 0.1–1.0)
Monocytes Relative: 10.4 % (ref 3.0–12.0)
NEUTROS ABS: 4.2 10*3/uL (ref 1.4–7.7)
Neutrophils Relative %: 60.4 % (ref 43.0–77.0)
Platelets: 116 10*3/uL — ABNORMAL LOW (ref 150.0–400.0)
RBC: 4.19 Mil/uL — AB (ref 4.22–5.81)
RDW: 13.5 % (ref 11.5–15.5)
WBC: 6.9 10*3/uL (ref 4.0–10.5)

## 2016-01-19 LAB — LIPID PANEL
CHOL/HDL RATIO: 6
Cholesterol: 159 mg/dL (ref 0–200)
HDL: 27 mg/dL — ABNORMAL LOW (ref >39.00)
LDL Cholesterol: 107 mg/dL — ABNORMAL HIGH (ref 0–99)
NONHDL: 131.89
Triglycerides: 125 mg/dL (ref 0.0–149.0)
VLDL: 25 mg/dL (ref 0.0–40.0)

## 2016-01-19 LAB — HEPATIC FUNCTION PANEL
ALBUMIN: 4 g/dL (ref 3.5–5.2)
ALK PHOS: 62 U/L (ref 39–117)
ALT: 27 U/L (ref 0–53)
AST: 26 U/L (ref 0–37)
Bilirubin, Direct: 0.2 mg/dL (ref 0.0–0.3)
TOTAL PROTEIN: 6.3 g/dL (ref 6.0–8.3)
Total Bilirubin: 1 mg/dL (ref 0.2–1.2)

## 2016-01-19 LAB — POC URINALSYSI DIPSTICK (AUTOMATED)
Glucose, UA: NEGATIVE
Ketones, UA: NEGATIVE
Leukocytes, UA: NEGATIVE
Nitrite, UA: NEGATIVE
PH UA: 5.5
SPEC GRAV UA: 1.025
UROBILINOGEN UA: 1

## 2016-01-19 LAB — BASIC METABOLIC PANEL
BUN: 20 mg/dL (ref 6–23)
CALCIUM: 8.6 mg/dL (ref 8.4–10.5)
CO2: 31 meq/L (ref 19–32)
Chloride: 104 mEq/L (ref 96–112)
Creatinine, Ser: 0.97 mg/dL (ref 0.40–1.50)
GFR: 82.81 mL/min (ref >60.00)
Glucose, Bld: 91 mg/dL (ref 70–99)
Potassium: 3.5 mEq/L (ref 3.5–5.1)
SODIUM: 143 meq/L (ref 135–145)

## 2016-01-19 LAB — TSH: TSH: 1 u[IU]/mL (ref 0.35–4.50)

## 2016-01-19 LAB — PSA: PSA: 0.8 ng/mL (ref 0.10–4.00)

## 2016-01-25 ENCOUNTER — Encounter: Payer: BLUE CROSS/BLUE SHIELD | Admitting: Family Medicine

## 2016-01-31 ENCOUNTER — Ambulatory Visit (INDEPENDENT_AMBULATORY_CARE_PROVIDER_SITE_OTHER): Payer: BLUE CROSS/BLUE SHIELD | Admitting: Family Medicine

## 2016-01-31 ENCOUNTER — Encounter: Payer: Self-pay | Admitting: Family Medicine

## 2016-01-31 VITALS — BP 130/66 | HR 57 | Temp 97.3°F | Ht 68.0 in | Wt 181.9 lb

## 2016-01-31 DIAGNOSIS — F17200 Nicotine dependence, unspecified, uncomplicated: Secondary | ICD-10-CM

## 2016-01-31 DIAGNOSIS — Z0000 Encounter for general adult medical examination without abnormal findings: Secondary | ICD-10-CM

## 2016-01-31 DIAGNOSIS — E7800 Pure hypercholesterolemia, unspecified: Secondary | ICD-10-CM | POA: Diagnosis not present

## 2016-01-31 DIAGNOSIS — I10 Essential (primary) hypertension: Secondary | ICD-10-CM | POA: Diagnosis not present

## 2016-01-31 DIAGNOSIS — F988 Other specified behavioral and emotional disorders with onset usually occurring in childhood and adolescence: Secondary | ICD-10-CM | POA: Diagnosis not present

## 2016-01-31 MED ORDER — LOSARTAN POTASSIUM 50 MG PO TABS: 50.0000 mg | ORAL_TABLET | Freq: Every day | ORAL | 4 refills | Status: DC

## 2016-01-31 MED ORDER — SERTRALINE HCL 50 MG PO TABS: 50.0000 mg | ORAL_TABLET | Freq: Every day | ORAL | 4 refills | Status: DC

## 2016-01-31 MED ORDER — HYDROCHLOROTHIAZIDE 12.5 MG PO CAPS: 12.5000 mg | ORAL_CAPSULE | Freq: Every day | ORAL | 4 refills | Status: DC

## 2016-01-31 MED ORDER — DOXAZOSIN MESYLATE 8 MG PO TABS: 8.0000 mg | ORAL_TABLET | Freq: Every morning | ORAL | 4 refills | Status: DC

## 2016-01-31 MED ORDER — VARENICLINE TARTRATE 0.5 MG PO TABS: 0.5000 mg | ORAL_TABLET | Freq: Two times a day (BID) | ORAL | 10 refills | Status: DC

## 2016-01-31 NOTE — Patient Instructions (Signed)
Chantix 0.5,,,,,,,,,,,,, one tablet daily in the morning  Begin tapering by one cigarette per week  Pulmonary consult for evaluation and to see if you would be a candidate for the CT scan screening for lung cancer program  Follow-up in one year sooner if any problems  Continue your other medications

## 2016-01-31 NOTE — Progress Notes (Signed)
Bobby Cuevas is a 66 year old married male smoker........Marland Kitchen 15 cigarettes per per day........ who's been given repeatedly the smoking cessation program but never has done it.... Who comes in today for general physical examination  He has a history of adult ADD is on Adderall 5 mg daily.  He uses aspirin, Cardura 8 mg, hydrochlorothiazide 12.5 mg, and losartan 50 mg daily for hypertension BP 130/66  For anxiety takes Zoloft 50 mg at bedtime  His biggest long-term health risk is discontinued smoking. We've discussed this in the past. He understands these increasing his risk of heart attack stroke lung cancer bladder cancer etc. etc. We've outlined smoking cessation programs in the past but he is never initiated any of the programs we've tried.  Vaccinations up-to-date except he is due a shingles vaccine. He'll call his insurance company to find out where you get it done the cheapest  He gets routine eye care, dental care, colonoscopy 2010 was normal  Vaccinations up-to-date except for above  14 point review of systems reviewed and otherwise negative  BP 130/66 (BP Location: Left Arm, Patient Position: Sitting, Cuff Size: Normal)   Pulse (!) 57   Temp 97.3 F (36.3 C) (Oral)   Ht 5\' 8"  (1.727 m)   Wt 181 lb 14.4 oz (82.5 kg)   BMI 27.66 kg/m  Examination of the HEENT were negative neck was supple no adenopathy. Thyroid normal no carotid bruits cardiopulmonary normal except for decreased breath sounds bilaterally from chronic tobacco abuse. Abdominal exam negative genitalia normal circumcised male rectal normal stool guaiac-negative prostate normal extremities normal skin normal peripheral pulses normal  #1 tobacco abuse........Marland Kitchen restart tapering wish Chantix..... Pulmonary consult for evaluation and to consider CT screening for lung cancer  #2 hypertension at goal,,,,,,,, continue current therapy  #3 adult ADD,,,,,,,, continue Adderall 5 mg daily  #4 history of depression,,,,,,,, continue  Zoloft 50 mg daily.

## 2016-02-07 ENCOUNTER — Telehealth: Payer: Self-pay | Admitting: Acute Care

## 2016-02-07 DIAGNOSIS — F17210 Nicotine dependence, cigarettes, uncomplicated: Principal | ICD-10-CM

## 2016-02-08 NOTE — Telephone Encounter (Signed)
Will forward to lung nodule pool.  

## 2016-02-14 NOTE — Telephone Encounter (Signed)
Patient is returning phone call to schedule app

## 2016-03-01 NOTE — Telephone Encounter (Signed)
LMTC x `1 for pt 

## 2016-03-01 NOTE — Telephone Encounter (Signed)
Pt returning call.Bobby Cuevas ° °

## 2016-03-01 NOTE — Telephone Encounter (Signed)
Spoke with pt and scheduled for [facility] 03/08/16 at 10:30 CT ordered Nothing further needed

## 2016-03-08 ENCOUNTER — Ambulatory Visit (INDEPENDENT_AMBULATORY_CARE_PROVIDER_SITE_OTHER): Payer: BLUE CROSS/BLUE SHIELD | Admitting: Acute Care

## 2016-03-08 ENCOUNTER — Ambulatory Visit (INDEPENDENT_AMBULATORY_CARE_PROVIDER_SITE_OTHER)
Admission: RE | Admit: 2016-03-08 | Discharge: 2016-03-08 | Disposition: A | Discharge status: Home / Self Care | Payer: BLUE CROSS/BLUE SHIELD | Source: Ambulatory Visit | Attending: Acute Care | Admitting: Acute Care

## 2016-03-08 ENCOUNTER — Encounter: Payer: Self-pay | Admitting: Acute Care

## 2016-03-08 DIAGNOSIS — F17210 Nicotine dependence, cigarettes, uncomplicated: Secondary | ICD-10-CM

## 2016-03-08 DIAGNOSIS — Z87891 Personal history of nicotine dependence: Secondary | ICD-10-CM | POA: Diagnosis not present

## 2016-03-08 NOTE — Progress Notes (Signed)
Shared Decision Making Visit Lung Cancer Screening Program [id number])   Eligibility:  Age 66 y.o.  Pack Years Smoking History Calculation 48 pack year smoking history (# packs/per year x # years smoked)  Recent History of coughing up blood  no  Unexplained weight loss? no ( >Than 15 pounds within the last 6 months )  Prior History Lung / other cancer no (Diagnosis within the last 5 years already requiring surveillance chest CT Scans).  Smoking Status Current Smoker  Former Smokers: Years since quit: NA  Quit Date: NA  Visit Components:  Discussion included one or more decision making aids. yes  Discussion included risk/benefits of screening. yes  Discussion included potential follow up diagnostic testing for abnormal scans. yes  Discussion included meaning and risk of over diagnosis. yes  Discussion included meaning and risk of False Positives. yes  Discussion included meaning of total radiation exposure. yes  Counseling Included:  Importance of adherence to annual lung cancer LDCT screening. yes  Impact of comorbidities on ability to participate in the program. yes  Ability and willingness to under diagnostic treatment. yes  Smoking Cessation Counseling:  Current Smokers:   Discussed importance of smoking cessation. yes  Information about tobacco cessation classes and interventions provided to patient. yes  Patient provided with "ticket" for LDCT Scan. yes  Symptomatic Patient. no  Counseling  Diagnosis Code: Tobacco Use Z72.0  Asymptomatic Patient yes  Counseling (Intermediate counseling: > three minutes counseling) [id number]  Former Smokers:   Discussed the importance of maintaining cigarette abstinence. yes  Diagnosis Code: Personal History of Nicotine Dependence. [id number]  Information about tobacco cessation classes and interventions provided to patient. Yes  Patient provided with "ticket" for LDCT Scan. yes  Written Order for Lung Cancer  Screening with LDCT placed in Epic. Yes (CT Chest Lung Cancer Screening Low Dose W/O CM) [id number] Z12.2-Screening of respiratory organs Z87.891-Personal history of nicotine dependence  I spent between 3 and 4 minutes counseling on smoking cessation during this visit.  I have spent 25 minutes of face to face time with [name] discussing the risks and benefits of lung cancer screening. We viewed a power point together that explained in detail the above noted topics. We paused at intervals to allow for questions to be asked and answered to ensure understanding.We discussed that the single most powerful action that he can take to decrease his risk of developing lung cancer is to quit smoking. We discussed whether or not he is ready to commit to setting a quit date. He is currently not ready to set a quit date. We discussed options for tools to aid in quitting smoking including nicotine replacement therapy, non-nicotine medications, support groups, Quit Smart classes, and behavior modification. We discussed that often times setting smaller, more achievable goals, such as eliminating 1 cigarette a day for a week and then 2 cigarettes a day for a week can be helpful in slowly decreasing the number of cigarettes smoked. This allows for a sense of accomplishment as well as providing a clinical benefit. I gave him the " Be Stronger Than Your Excuses" card with contact information for community resources, classes, free nicotine replacement therapy, and access to mobile apps, text messaging, and on-line smoking cessation help. I have also given him my card and contact information in the event he needs to contact me. We discussed the time and location of the scan, and that either June Leap, CMA, or I will call with the results within 24-48 hours of receiving  them. I have provided him with a copy of the power point we viewed  as a resource in the event they need reinforcement of the concepts we discussed today in  the office. The patient verbalized understanding of all of  the above and had no further questions upon leaving the office. They have my contact information in the event they have any further questions.   Magdalen Spatz, NP 03/08/2016

## 2016-03-17 ENCOUNTER — Telehealth: Payer: Self-pay | Admitting: Acute Care

## 2016-03-17 DIAGNOSIS — F17210 Nicotine dependence, cigarettes, uncomplicated: Principal | ICD-10-CM

## 2016-03-17 NOTE — Telephone Encounter (Signed)
Bobby Form, NP spoke with pt and informed of low dose chest ct results.  Copy sent to Dr Sherren Mocha. Order placed for 1 yr f/u CT.

## 2016-03-28 ENCOUNTER — Telehealth: Payer: Self-pay | Admitting: Family Medicine

## 2016-03-28 NOTE — Telephone Encounter (Signed)
Refill once in Dr Honor Junes absence.

## 2016-03-28 NOTE — Telephone Encounter (Signed)
Last filled 12/01/15.  Please advise.  Thanks!!

## 2016-03-28 NOTE — Telephone Encounter (Signed)
Pt need new Rx for ADDERALL XR   Pt is aware of 3 business days for refills and the office will give him a call when ready.

## 2016-03-29 MED ORDER — AMPHETAMINE-DEXTROAMPHET ER 5 MG PO CP24: 5.0000 mg | ORAL_CAPSULE | Freq: Every day | ORAL | 0 refills | Status: DC

## 2016-03-29 NOTE — Telephone Encounter (Signed)
Left a message on identified cell for the pt to pick up at the front desk.  Filled for 1 month by Dr. Elease Hashimoto is the absence of Dr. Sherren Mocha.

## 2016-03-29 NOTE — Telephone Encounter (Signed)
Printed for Dr. Jacinto Reap to sign.

## 2016-03-29 NOTE — Addendum Note (Signed)
Addended by: Miles Costain T on: 03/29/2016 12:37 PM   Modules accepted: Orders

## 2016-05-09 ENCOUNTER — Telehealth: Payer: Self-pay | Admitting: Family Medicine

## 2016-05-09 NOTE — Telephone Encounter (Signed)
Dr. Elease Hashimoto filled for 1 month on 03/29/16.  Dr. Sherren Mocha not in the office this week.  Will see if Dr. Elease Hashimoto will fill for another month.

## 2016-05-09 NOTE — Telephone Encounter (Signed)
OK 

## 2016-05-09 NOTE — Telephone Encounter (Signed)
Pt need new Rx for Adderall   Pt is aware of 3 business days for refills and someone will call when ready for pick up. °

## 2016-05-10 MED ORDER — AMPHETAMINE-DEXTROAMPHET ER 5 MG PO CP24: 5.0000 mg | ORAL_CAPSULE | Freq: Every day | ORAL | 0 refills | Status: DC

## 2016-05-10 NOTE — Telephone Encounter (Signed)
Printed for Dr. Jacinto Reap to sign.

## 2016-05-10 NOTE — Telephone Encounter (Signed)
Left a message on identified voicemail informing the pt to pickup at the front desk.

## 2016-06-09 ENCOUNTER — Telehealth: Payer: Self-pay | Admitting: Family Medicine

## 2016-06-09 NOTE — Telephone Encounter (Signed)
Pt request refill  °amphetamine-dextroamphetamine (ADDERALL XR) 5 MG 24 hr capsule °

## 2016-06-14 MED ORDER — AMPHETAMINE-DEXTROAMPHET ER 5 MG PO CP24: 5.0000 mg | ORAL_CAPSULE | Freq: Every day | ORAL | 0 refills | Status: DC

## 2016-06-14 NOTE — Telephone Encounter (Signed)
Spoke to the pt and informed him that his rx is available for pick up at the front desk.  Dr. Sherren Mocha left the pt a note on the bottom of the prescription informing him to establish with another provider.  I informed pt as well.  Pt agreed to make an appt when he came to pick up rx.

## 2016-06-14 NOTE — Telephone Encounter (Signed)
Printed for Dr. Sherren Mocha to sign.

## 2016-07-05 ENCOUNTER — Ambulatory Visit (INDEPENDENT_AMBULATORY_CARE_PROVIDER_SITE_OTHER): Payer: BLUE CROSS/BLUE SHIELD | Admitting: Family Medicine

## 2016-07-05 ENCOUNTER — Encounter: Payer: Self-pay | Admitting: Family Medicine

## 2016-07-05 VITALS — BP 134/76 | HR 74 | Temp 98.9°F | Ht 68.0 in | Wt 189.0 lb

## 2016-07-05 DIAGNOSIS — F17200 Nicotine dependence, unspecified, uncomplicated: Secondary | ICD-10-CM | POA: Diagnosis not present

## 2016-07-05 DIAGNOSIS — I10 Essential (primary) hypertension: Secondary | ICD-10-CM

## 2016-07-05 DIAGNOSIS — F900 Attention-deficit hyperactivity disorder, predominantly inattentive type: Secondary | ICD-10-CM

## 2016-07-05 DIAGNOSIS — J438 Other emphysema: Secondary | ICD-10-CM | POA: Diagnosis not present

## 2016-07-05 DIAGNOSIS — J439 Emphysema, unspecified: Secondary | ICD-10-CM | POA: Insufficient documentation

## 2016-07-05 NOTE — Patient Instructions (Signed)
WE NOW OFFER   Bobby Cuevas's FAST TRACK!!!  SAME DAY Appointments for ACUTE CARE  Such as: Sprains, Injuries, cuts, abrasions, rashes, muscle pain, joint pain, back pain Colds, flu, sore throats, headache, allergies, cough, fever  Ear pain, sinus and eye infections Abdominal pain, nausea, vomiting, diarrhea, upset stomach Animal/insect bites  3 Easy Ways to Schedule: Walk-In Scheduling Call in scheduling Mychart Sign-up: [email or web address]         

## 2016-07-05 NOTE — Progress Notes (Signed)
   Subjective:    Patient ID: Bobby Cuevas, male    DOB: 17-Mar-1950, 66 y.o.   MRN: [id number]  HPI 66 yr old male to establish with [place] after transferring from Dr. Sherren Mocha. He feels fine today. He is treated for HTN and his BP has been stable. He was diagnosed with ADHD about 5 years ago and this is well controlled on low dose Adderall. He takes this almost every day, but he will skip a day on the weekend at times. His anxiety is stable on Zoloft. His last well exam was on 03-08-16.    Review of Systems  Constitutional: Negative.   Respiratory: Negative.   Cardiovascular: Negative.   Gastrointestinal: Negative.   Neurological: Negative.        Objective:   Physical Exam  Constitutional: He is oriented to person, place, and time. He appears well-developed and well-nourished.  Cardiovascular: Normal rate, regular rhythm, normal heart sounds and intact distal pulses.   Pulmonary/Chest: Effort normal and breath sounds normal. No respiratory distress. He has no wheezes. He has no rales.  Musculoskeletal: He exhibits no edema.  Neurological: He is alert and oriented to person, place, and time.  Psychiatric: He has a normal mood and affect. His behavior is normal. Thought content normal.          Assessment & Plan:  His HTN is stable. His anxiety and ADHD are well controlled. His COPD is stable. He knows he needs to quit smoking. He is past due for a colonoscopy.  [name], MD

## 2016-09-13 ENCOUNTER — Telehealth: Payer: Self-pay | Admitting: Family Medicine

## 2016-09-13 MED ORDER — AMPHETAMINE-DEXTROAMPHET ER 5 MG PO CP24: 5.0000 mg | ORAL_CAPSULE | Freq: Every day | ORAL | 0 refills | Status: DC

## 2016-09-13 NOTE — Telephone Encounter (Signed)
Pt request refill  amphetamine-dextroamphetamine (ADDERALL XR) 5 MG 24 hr capsule

## 2016-09-13 NOTE — Telephone Encounter (Signed)
done

## 2016-09-14 NOTE — Telephone Encounter (Signed)
Script is ready for pick up here at front office and I spoke with pt.  

## 2016-12-19 ENCOUNTER — Telehealth: Payer: Self-pay | Admitting: Family Medicine

## 2016-12-19 MED ORDER — AMPHETAMINE-DEXTROAMPHET ER 5 MG PO CP24: 5.0000 mg | ORAL_CAPSULE | Freq: Every day | ORAL | 0 refills | Status: DC

## 2016-12-19 NOTE — Telephone Encounter (Signed)
Done

## 2016-12-19 NOTE — Addendum Note (Signed)
Addended by: [name] A on: 12/19/2016 05:01 PM   Modules accepted: Orders

## 2016-12-19 NOTE — Telephone Encounter (Signed)
Sent to PCP for approval.  

## 2016-12-19 NOTE — Telephone Encounter (Signed)
Called pt left a VM Rx is ready for pick up. Rx placed up front for pick up.

## 2016-12-19 NOTE — Telephone Encounter (Signed)
Copied from [place]. Topic: Quick Communication - See Telephone Encounter >> Dec 18, 2016  2:13 PM Yvette Rack wrote: CRM for notification. See Telephone encounter for:  pt need a refill on his Adderal 5mg   12/18/16. / Medication refill for Adderal / Bobby Cuevas 07/05/2016 with Dr. Sarajane Jews

## 2017-01-26 ENCOUNTER — Other Ambulatory Visit: Payer: BLUE CROSS/BLUE SHIELD

## 2017-01-31 ENCOUNTER — Encounter: Payer: BLUE CROSS/BLUE SHIELD | Admitting: Family Medicine

## 2017-02-07 ENCOUNTER — Encounter: Payer: Self-pay | Admitting: Family Medicine

## 2017-02-07 ENCOUNTER — Ambulatory Visit (INDEPENDENT_AMBULATORY_CARE_PROVIDER_SITE_OTHER): Payer: Medicare Other | Admitting: Family Medicine

## 2017-02-07 VITALS — BP 114/62 | HR 53 | Temp 98.4°F | Ht 68.25 in | Wt 182.6 lb

## 2017-02-07 DIAGNOSIS — I10 Essential (primary) hypertension: Secondary | ICD-10-CM

## 2017-02-07 DIAGNOSIS — Z23 Encounter for immunization: Secondary | ICD-10-CM | POA: Diagnosis not present

## 2017-02-07 DIAGNOSIS — M25512 Pain in left shoulder: Secondary | ICD-10-CM | POA: Diagnosis not present

## 2017-02-07 DIAGNOSIS — M5431 Sciatica, right side: Secondary | ICD-10-CM | POA: Diagnosis not present

## 2017-02-07 DIAGNOSIS — E7800 Pure hypercholesterolemia, unspecified: Secondary | ICD-10-CM

## 2017-02-07 DIAGNOSIS — Z0000 Encounter for general adult medical examination without abnormal findings: Secondary | ICD-10-CM

## 2017-02-07 DIAGNOSIS — F419 Anxiety disorder, unspecified: Secondary | ICD-10-CM | POA: Diagnosis not present

## 2017-02-07 DIAGNOSIS — N138 Other obstructive and reflux uropathy: Secondary | ICD-10-CM

## 2017-02-07 DIAGNOSIS — F900 Attention-deficit hyperactivity disorder, predominantly inattentive type: Secondary | ICD-10-CM | POA: Diagnosis not present

## 2017-02-07 DIAGNOSIS — G8929 Other chronic pain: Secondary | ICD-10-CM | POA: Diagnosis not present

## 2017-02-07 DIAGNOSIS — N401 Enlarged prostate with lower urinary tract symptoms: Secondary | ICD-10-CM

## 2017-02-07 DIAGNOSIS — J439 Emphysema, unspecified: Secondary | ICD-10-CM | POA: Diagnosis not present

## 2017-02-07 LAB — CBC WITH DIFFERENTIAL/PLATELET
BASOS ABS: 0 10*3/uL (ref 0.0–0.1)
BASOS PCT: 0.5 % (ref 0.0–3.0)
EOS ABS: 0.2 10*3/uL (ref 0.0–0.7)
Eosinophils Relative: 2.2 % (ref 0.0–5.0)
HCT: 46 % (ref 39.0–52.0)
Hemoglobin: 15.8 g/dL (ref 13.0–17.0)
Lymphocytes Relative: 28.7 % (ref 12.0–46.0)
Lymphs Abs: 2 10*3/uL (ref 0.7–4.0)
MCHC: 34.4 g/dL (ref 30.0–36.0)
MCV: 98.6 fl (ref 78.0–100.0)
MONO ABS: 0.5 10*3/uL (ref 0.1–1.0)
Monocytes Relative: 6.8 % (ref 3.0–12.0)
Neutro Abs: 4.4 10*3/uL (ref 1.4–7.7)
Neutrophils Relative %: 61.8 % (ref 43.0–77.0)
PLATELETS: 146 10*3/uL — AB (ref 150.0–400.0)
RBC: 4.67 Mil/uL (ref 4.22–5.81)
RDW: 13.3 % (ref 11.5–15.5)
WBC: 7.1 10*3/uL (ref 4.0–10.5)

## 2017-02-07 LAB — BASIC METABOLIC PANEL
BUN: 25 mg/dL — ABNORMAL HIGH (ref 6–23)
CALCIUM: 9.1 mg/dL (ref 8.4–10.5)
CO2: 27 mEq/L (ref 19–32)
CREATININE: 0.95 mg/dL (ref 0.40–1.50)
Chloride: 105 mEq/L (ref 96–112)
GFR: 84.54 mL/min (ref >60.00)
GLUCOSE: 85 mg/dL (ref 70–99)
POTASSIUM: 3.8 meq/L (ref 3.5–5.1)
Sodium: 143 mEq/L (ref 135–145)

## 2017-02-07 LAB — LIPID PANEL
CHOL/HDL RATIO: 6
CHOLESTEROL: 185 mg/dL (ref 0–200)
HDL: 32.4 mg/dL — ABNORMAL LOW (ref >39.00)
LDL Cholesterol: 117 mg/dL — ABNORMAL HIGH (ref 0–99)
NonHDL: 152.48
TRIGLYCERIDES: 175 mg/dL — AB (ref 0.0–149.0)
VLDL: 35 mg/dL (ref 0.0–40.0)

## 2017-02-07 LAB — HEPATIC FUNCTION PANEL
ALBUMIN: 4.4 g/dL (ref 3.5–5.2)
ALK PHOS: 68 U/L (ref 39–117)
ALT: 13 U/L (ref 0–53)
AST: 13 U/L (ref 0–37)
Bilirubin, Direct: 0.1 mg/dL (ref 0.0–0.3)
Total Bilirubin: 0.7 mg/dL (ref 0.2–1.2)
Total Protein: 7 g/dL (ref 6.0–8.3)

## 2017-02-07 LAB — POC URINALSYSI DIPSTICK (AUTOMATED)
Bilirubin, UA: NEGATIVE
GLUCOSE UA: NEGATIVE
Ketones, UA: NEGATIVE
Leukocytes, UA: NEGATIVE
NITRITE UA: NEGATIVE
PH UA: 5 (ref 5.0–8.0)
Protein, UA: NEGATIVE
RBC UA: NEGATIVE
Urobilinogen, UA: 0.2 E.U./dL

## 2017-02-07 LAB — PSA: PSA: 1.09 ng/mL (ref 0.10–4.00)

## 2017-02-07 LAB — TSH: TSH: 2.23 u[IU]/mL (ref 0.35–4.50)

## 2017-02-07 MED ORDER — SERTRALINE HCL 50 MG PO TABS: 50.0000 mg | ORAL_TABLET | Freq: Every day | ORAL | 3 refills | Status: DC

## 2017-02-07 MED ORDER — DOXAZOSIN MESYLATE 8 MG PO TABS: 8.0000 mg | ORAL_TABLET | Freq: Every morning | ORAL | 3 refills | Status: DC

## 2017-02-07 MED ORDER — HYDROCHLOROTHIAZIDE 12.5 MG PO CAPS: 12.5000 mg | ORAL_CAPSULE | Freq: Every day | ORAL | 3 refills | Status: DC

## 2017-02-07 MED ORDER — LOSARTAN POTASSIUM 50 MG PO TABS: 50.0000 mg | ORAL_TABLET | Freq: Every day | ORAL | 3 refills | Status: DC

## 2017-02-07 NOTE — Progress Notes (Signed)
   Subjective:    Patient ID: Bobby Cuevas, male    DOB: 1950/10/29, 67 y.o.   MRN: [id number]  HPI Here to follow up on issues. He has a few problems to discuss. He has had stiffness and pain in the left shoulder for about 3 months. He takes Aleve once or twice a day. He is concerned because he had a frozen shoulder on the right side in the past. Also for 4-5 months he has had intermittent pain in the right lower back which radiates to the right buttock and thigh. No weakness or numbness. His BP is stable. His anxiety and ADHD are stable    Review of Systems  Constitutional: Negative.   HENT: Negative.   Eyes: Negative.   Respiratory: Negative.   Cardiovascular: Negative.   Gastrointestinal: Negative.   Genitourinary: Negative.   Musculoskeletal: Positive for arthralgias and back pain.  Skin: Negative.   Neurological: Negative.   Psychiatric/Behavioral: Negative.        Objective:   Physical Exam  Constitutional: He is oriented to person, place, and time. He appears well-developed and well-nourished. No distress.  HENT:  Head: Normocephalic and atraumatic.  Right Ear: External ear normal.  Left Ear: External ear normal.  Nose: Nose normal.  Mouth/Throat: Oropharynx is clear and moist. No oropharyngeal exudate.  Eyes: Conjunctivae and EOM are normal. Pupils are equal, round, and reactive to light. Right eye exhibits no discharge. Left eye exhibits no discharge. No scleral icterus.  Neck: Neck supple. No JVD present. No tracheal deviation present. No thyromegaly present.  Cardiovascular: Normal rate, regular rhythm, normal heart sounds and intact distal pulses. Exam reveals no gallop and no friction rub.  No murmur heard. Pulmonary/Chest: Effort normal and breath sounds normal. No respiratory distress. He has no wheezes. He has no rales. He exhibits no tenderness.  Abdominal: Soft. Bowel sounds are normal. He exhibits no distension and no mass. There is no tenderness. There  is no rebound and no guarding.  Genitourinary: Rectum normal, prostate normal and penis normal. Rectal exam shows guaiac negative stool. No penile tenderness.  Musculoskeletal: Normal range of motion. He exhibits no edema.  The lower back is not tender but he is tender over the right sciatic notch. ROM is full but SLR is positive on both sides. The left shoulder is tender in the subAC area and ROM is very limited by pain  Lymphadenopathy:    He has no cervical adenopathy.  Neurological: He is alert and oriented to person, place, and time. He has normal reflexes. No cranial nerve deficit. He exhibits normal muscle tone. Coordination normal.  Skin: Skin is warm and dry. No rash noted. He is not diaphoretic. No erythema. No pallor.  Psychiatric: He has a normal mood and affect. His behavior is normal. Judgment and thought content normal.          Assessment & Plan:  His HTN and anxiety and ADHD are stable. COPD is stable. Get fasting labs. Set up another colonoscopy. He will be due for his yearly low dose chest CT next month. Get a lumbar MRI for the sciatica. Refer to Orthopedics for the left shoulder pain. [name], MD

## 2017-02-08 ENCOUNTER — Encounter: Payer: Self-pay | Admitting: Gastroenterology

## 2017-03-09 ENCOUNTER — Ambulatory Visit (INDEPENDENT_AMBULATORY_CARE_PROVIDER_SITE_OTHER)
Admission: RE | Admit: 2017-03-09 | Discharge: 2017-03-09 | Disposition: A | Discharge status: Home / Self Care | Payer: Medicare Other | Source: Ambulatory Visit | Attending: Acute Care | Admitting: Acute Care

## 2017-03-09 DIAGNOSIS — F17210 Nicotine dependence, cigarettes, uncomplicated: Secondary | ICD-10-CM | POA: Diagnosis not present

## 2017-03-14 ENCOUNTER — Telehealth: Payer: Self-pay | Admitting: Acute Care

## 2017-03-15 NOTE — Telephone Encounter (Signed)
Patient called back and is very upset that Langley Gauss is not here today and when advised she will be back on Monday, he insisted that someone call him today with his results.  He hung up before I could get his call back number.

## 2017-03-15 NOTE — Telephone Encounter (Signed)
Called pt to let him know the results of the Low Dose CT scan he had based on the results stated by Eric Form, NP.  Stated to pt we would have the scan repeated 1 year from the time this current scan was done and that Langley Gauss would order the scan and would call him as it got closer to time for him to have it repeated.  Pt expressed understanding. Also stated to pt we would have the results faxed to PCP for them to have.  Langley Gauss, can you order pt's Low Dose Cancer Screening exam to be done 02/2018 per radiology recommendations as stated by SG.  Thanks!

## 2017-03-15 NOTE — Telephone Encounter (Signed)
Called and spoke with pt and he stated that someone had just called him with these results. Nothing further is needed.

## 2017-03-15 NOTE — Telephone Encounter (Signed)
Pt is calling back [phone] he is very upset he wants someone to call him back today.

## 2017-03-16 NOTE — Progress Notes (Signed)
See phone note from 2.27.2019.

## 2017-03-19 ENCOUNTER — Other Ambulatory Visit: Payer: Self-pay | Admitting: Acute Care

## 2017-03-19 DIAGNOSIS — Z122 Encounter for screening for malignant neoplasm of respiratory organs: Secondary | ICD-10-CM

## 2017-03-19 DIAGNOSIS — F17210 Nicotine dependence, cigarettes, uncomplicated: Principal | ICD-10-CM

## 2017-03-30 ENCOUNTER — Other Ambulatory Visit: Payer: Self-pay | Admitting: Family Medicine

## 2017-03-30 ENCOUNTER — Telehealth: Payer: Self-pay | Admitting: Family Medicine

## 2017-03-30 ENCOUNTER — Other Ambulatory Visit: Payer: Self-pay | Admitting: *Deleted

## 2017-03-30 NOTE — Progress Notes (Signed)
Opened in error

## 2017-03-30 NOTE — Telephone Encounter (Signed)
LOV 02/07/17  Adderall refill request - he comes by and picks it up.   Please call and let him know when it's ready.  Thanks.  Dr. Sarajane Jews   He was also inquiring about CT scan results.  I don't see another CT scan being done since Feb. Of this year and the results were phoned to him by your office staff.    Not sure what results he is referring to.

## 2017-03-30 NOTE — Telephone Encounter (Signed)
Sent to PCP for approval.  

## 2017-04-02 MED ORDER — AMPHETAMINE-DEXTROAMPHET ER 5 MG PO CP24: 5.0000 mg | ORAL_CAPSULE | Freq: Every day | ORAL | 0 refills | Status: DC

## 2017-04-02 NOTE — Telephone Encounter (Signed)
These were sent in electronically

## 2017-04-03 NOTE — Telephone Encounter (Signed)
Opened in error

## 2017-04-04 ENCOUNTER — Other Ambulatory Visit: Payer: Self-pay

## 2017-04-04 ENCOUNTER — Telehealth: Payer: Self-pay | Admitting: Family Medicine

## 2017-04-04 ENCOUNTER — Ambulatory Visit (AMBULATORY_SURGERY_CENTER): Payer: Self-pay | Admitting: *Deleted

## 2017-04-04 VITALS — Ht 68.25 in | Wt 182.0 lb

## 2017-04-04 DIAGNOSIS — Z1211 Encounter for screening for malignant neoplasm of colon: Secondary | ICD-10-CM

## 2017-04-04 MED ORDER — PEG 3350-KCL-NA BICARB-NACL 420 G PO SOLR
ORAL | 0 refills | Status: DC
Start: 2017-04-04 — End: 2017-04-18

## 2017-04-04 NOTE — Telephone Encounter (Signed)
Copied from [place]. Topic: Quick Communication - Rx Refill/Question >> Apr 04, 2017  3:30 PM Boyd Kerbs wrote:  Medication:  amphetamine-dextroamphetamine (ADDERALL XR) 5 MG 24 hr   Has the patient contacted their pharmacy? Yes.   [facility] to [facility]  (Agent: If no, request that the patient contact the pharmacy for the refill.)   Preferred Pharmacy (with phone number or street name):   [id number] [place], [address] [place] [address] Phone: [phone] Fax: [phone]     Agent: Please be advised that RX refills may take up to 3 business days. We ask that you follow-up with your pharmacy.

## 2017-04-04 NOTE — Progress Notes (Signed)
Patient denies any allergies to eggs or soy. Patient denies any problems with anesthesia/sedation. Patient denies any oxygen use at home. Patient denies taking any diet/weight loss medications or blood thinners. EMMI education assisgned to patient on colonoscopy, this was explained and instructions given to patient. 

## 2017-04-05 MED ORDER — AMPHETAMINE-DEXTROAMPHET ER 5 MG PO CP24: 5.0000 mg | ORAL_CAPSULE | Freq: Every day | ORAL | 0 refills | Status: DC

## 2017-04-05 MED ORDER — AMPHETAMINE-DEXTROAMPHET ER 5 MG PO CP24
5.0000 mg | ORAL_CAPSULE | Freq: Every day | ORAL | 0 refills | Status: DC
Start: 2017-04-05 — End: 2017-07-31

## 2017-04-05 NOTE — Telephone Encounter (Signed)
Adderall XR was sent to wrong pharmacy.  Please send to [facility], [address].  Dr. Sarajane Jews  Thanks.

## 2017-04-05 NOTE — Telephone Encounter (Signed)
[facility] [place]. Called the pharmacy and canceled all three Rx's that were sent 04/02/2017.   [facility] sent to PCP to resend.  Pharmacy updated.

## 2017-04-05 NOTE — Telephone Encounter (Signed)
Done

## 2017-04-06 NOTE — Telephone Encounter (Signed)
Called and spoke with pt. Pt advised and voiced understanding.  

## 2017-04-18 ENCOUNTER — Ambulatory Visit (AMBULATORY_SURGERY_CENTER): Payer: Medicare Other | Admitting: Gastroenterology

## 2017-04-18 ENCOUNTER — Other Ambulatory Visit: Payer: Self-pay

## 2017-04-18 ENCOUNTER — Encounter: Payer: Self-pay | Admitting: Gastroenterology

## 2017-04-18 VITALS — BP 126/73 | HR 52 | Temp 98.6°F | Resp 17 | Ht 68.25 in | Wt 182.0 lb

## 2017-04-18 DIAGNOSIS — K5730 Diverticulosis of large intestine without perforation or abscess without bleeding: Secondary | ICD-10-CM

## 2017-04-18 DIAGNOSIS — J449 Chronic obstructive pulmonary disease, unspecified: Secondary | ICD-10-CM | POA: Diagnosis not present

## 2017-04-18 DIAGNOSIS — D123 Benign neoplasm of transverse colon: Secondary | ICD-10-CM | POA: Diagnosis not present

## 2017-04-18 DIAGNOSIS — I10 Essential (primary) hypertension: Secondary | ICD-10-CM | POA: Diagnosis not present

## 2017-04-18 DIAGNOSIS — Z86010 Personal history of colonic polyps: Secondary | ICD-10-CM | POA: Diagnosis not present

## 2017-04-18 DIAGNOSIS — D122 Benign neoplasm of ascending colon: Secondary | ICD-10-CM

## 2017-04-18 DIAGNOSIS — Z1211 Encounter for screening for malignant neoplasm of colon: Secondary | ICD-10-CM

## 2017-04-18 HISTORY — PX: COLONOSCOPY: SHX174

## 2017-04-18 MED ORDER — SODIUM CHLORIDE 0.9 % IV SOLN: 500.0000 mL | Freq: Once | INTRAVENOUS | Status: AC

## 2017-04-18 NOTE — Op Note (Signed)
[facility] Patient Name: Bobby Cuevas Procedure Date: 04/18/2017 10:06 AM MRN: [id number] Endoscopist: Milus Banister , MD Age: 67 Referring MD:  Date of Birth: Jul 10, 1950 Gender: Male Account #: [id number] Procedure:                Colonoscopy Indications:              Screening for colorectal malignant neoplasm;                            colonoscopy 2007 no polyps Medicines:                Monitored Anesthesia Care Procedure:                Pre-Anesthesia Assessment:                           - Prior to the procedure, a History and Physical                            was performed, and patient medications and                            allergies were reviewed. The patient's tolerance of                            previous anesthesia was also reviewed. The risks                            and benefits of the procedure and the sedation                            options and risks were discussed with the patient.                            All questions were answered, and informed consent                            was obtained. Prior Anticoagulants: The patient has                            taken no previous anticoagulant or antiplatelet                            agents. ASA Grade Assessment: II - A patient with                            mild systemic disease. After reviewing the risks                            and benefits, the patient was deemed in                            satisfactory condition to undergo the procedure.  After obtaining informed consent, the colonoscope                            was passed under direct vision. Throughout the                            procedure, the patient's blood pressure, pulse, and                            oxygen saturations were monitored continuously. The                            Colonoscope was introduced through the anus and                            advanced to the the cecum, identified  by                            appendiceal orifice and ileocecal valve. The                            colonoscopy was performed without difficulty. The                            patient tolerated the procedure well. The quality                            of the bowel preparation was good. The ileocecal                            valve, appendiceal orifice, and rectum were                            photographed. Scope In: 10:14:43 AM Scope Out: 10:32:13 AM Scope Withdrawal Time: 0 hours 15 minutes 11 seconds  Total Procedure Duration: 0 hours 17 minutes 30 seconds  Findings:                 Two sessile polyps were found in the transverse                            colon and ascending colon. The polyps were 2 to 3                            mm in size. These polyps were removed with a cold                            snare. Resection and retrieval were complete.                           Many small and large-mouthed diverticula were found                            in the left colon.  The exam was otherwise without abnormality on                            direct and retroflexion views. Complications:            No immediate complications. Estimated blood loss:                            None. Estimated Blood Loss:     Estimated blood loss: none. Impression:               - Two 2 to 3 mm polyps in the transverse colon and                            in the ascending colon, removed with a cold snare.                            Resected and retrieved.                           - Diverticulosis in the left colon.                           - The examination was otherwise normal on direct                            and retroflexion views. Recommendation:           - Patient has a contact number available for                            emergencies. The signs and symptoms of potential                            delayed complications were discussed with the                             patient. Return to normal activities tomorrow.                            Written discharge instructions were provided to the                            patient.                           - Resume previous diet.                           - Continue present medications.                           You will receive a letter within 2-3 weeks with the                            pathology results and my final recommendations.  If the polyp(s) is proven to be 'pre-cancerous' on                            pathology, you will need repeat colonoscopy in 5                            years. If the polyp(s) is NOT 'precancerous' on                            pathology then you should repeat colon cancer                            screening in 10 years with colonoscopy without need                            for colon cancer screening by any method prior to                            then (including stool testing). Milus Banister, MD 04/18/2017 10:34:33 AM This report has been signed electronically.

## 2017-04-18 NOTE — Progress Notes (Signed)
Pt's states no medical or surgical changes since previsit or office visit. Pt saw Sundra Aland, RN on 04-04-17 and she did not sign the consent for screening colon or pre-procedure pt acknowledge sheet.  I had the pt initial and I signed the consent today. maw

## 2017-04-18 NOTE — Progress Notes (Signed)
Report given to PACU, vss 

## 2017-04-18 NOTE — Progress Notes (Signed)
Called to room to assist during endoscopic procedure.  Patient ID and intended procedure confirmed with present staff. Received instructions for my participation in the procedure from the performing physician.  

## 2017-04-18 NOTE — Patient Instructions (Signed)
INFORMATION ON POLYPS AND DIVERTICULOSIS GIVEN.   YOU HAD AN ENDOSCOPIC PROCEDURE TODAY AT THE [facility]:   Refer to the procedure report that was given to you for any specific questions about what was found during the examination.  If the procedure report does not answer your questions, please call your gastroenterologist to clarify.  If you requested that your care partner not be given the details of your procedure findings, then the procedure report has been included in a sealed envelope for you to review at your convenience later.  YOU SHOULD EXPECT: Some feelings of bloating in the abdomen. Passage of more gas than usual.  Walking can help get rid of the air that was put into your GI tract during the procedure and reduce the bloating. If you had a lower endoscopy (such as a colonoscopy or flexible sigmoidoscopy) you may notice spotting of blood in your stool or on the toilet paper. If you underwent a bowel prep for your procedure, you may not have a normal bowel movement for a few days.  Please Note:  You might notice some irritation and congestion in your nose or some drainage.  This is from the oxygen used during your procedure.  There is no need for concern and it should clear up in a day or so.  SYMPTOMS TO REPORT IMMEDIATELY:   Following lower endoscopy (colonoscopy or flexible sigmoidoscopy):  Excessive amounts of blood in the stool  Significant tenderness or worsening of abdominal pains  Swelling of the abdomen that is new, acute  Fever of 100F or higher    For urgent or emergent issues, a gastroenterologist can be reached at any hour by calling [phone].   DIET:  We do recommend a small meal at first, but then you may proceed to your regular diet.  Drink plenty of fluids but you should avoid alcoholic beverages for 24 hours.  ACTIVITY:  You should plan to take it easy for the rest of today and you should NOT DRIVE or use heavy machinery until tomorrow  (because of the sedation medicines used during the test).    FOLLOW UP: Our staff will call the number listed on your records the next business day following your procedure to check on you and address any questions or concerns that you may have regarding the information given to you following your procedure. If we do not reach you, we will leave a message.  However, if you are feeling well and you are not experiencing any problems, there is no need to return our call.  We will assume that you have returned to your regular daily activities without incident.  If any biopsies were taken you will be contacted by phone or by letter within the next 1-3 weeks.  Please call us at [phone] if you have not heard about the biopsies in 3 weeks.    SIGNATURES/CONFIDENTIALITY: You and/or your care partner have signed paperwork which will be entered into your electronic medical record.  These signatures attest to the fact that that the information above on your After Visit Summary has been reviewed and is understood.  Full responsibility of the confidentiality of this discharge information lies with you and/or your care-partner. 

## 2017-04-19 ENCOUNTER — Telehealth: Payer: Self-pay

## 2017-04-19 NOTE — Telephone Encounter (Signed)
Left message on answering machine. 

## 2017-04-24 ENCOUNTER — Encounter: Payer: Self-pay | Admitting: Gastroenterology

## 2017-05-01 DIAGNOSIS — M7502 Adhesive capsulitis of left shoulder: Secondary | ICD-10-CM | POA: Diagnosis not present

## 2017-05-01 DIAGNOSIS — M25512 Pain in left shoulder: Secondary | ICD-10-CM | POA: Diagnosis not present

## 2017-05-08 DIAGNOSIS — M7502 Adhesive capsulitis of left shoulder: Secondary | ICD-10-CM | POA: Diagnosis not present

## 2017-05-08 DIAGNOSIS — K1321 Leukoplakia of oral mucosa, including tongue: Secondary | ICD-10-CM | POA: Diagnosis not present

## 2017-05-09 DIAGNOSIS — K1321 Leukoplakia of oral mucosa, including tongue: Secondary | ICD-10-CM | POA: Diagnosis not present

## 2017-05-11 DIAGNOSIS — M7502 Adhesive capsulitis of left shoulder: Secondary | ICD-10-CM | POA: Diagnosis not present

## 2017-05-15 DIAGNOSIS — M7502 Adhesive capsulitis of left shoulder: Secondary | ICD-10-CM | POA: Diagnosis not present

## 2017-05-17 DIAGNOSIS — K1321 Leukoplakia of oral mucosa, including tongue: Secondary | ICD-10-CM | POA: Diagnosis not present

## 2017-05-18 DIAGNOSIS — M7502 Adhesive capsulitis of left shoulder: Secondary | ICD-10-CM | POA: Diagnosis not present

## 2017-05-22 DIAGNOSIS — M7502 Adhesive capsulitis of left shoulder: Secondary | ICD-10-CM | POA: Diagnosis not present

## 2017-05-25 DIAGNOSIS — M7502 Adhesive capsulitis of left shoulder: Secondary | ICD-10-CM | POA: Diagnosis not present

## 2017-05-29 DIAGNOSIS — M7502 Adhesive capsulitis of left shoulder: Secondary | ICD-10-CM | POA: Diagnosis not present

## 2017-06-01 DIAGNOSIS — M7502 Adhesive capsulitis of left shoulder: Secondary | ICD-10-CM | POA: Diagnosis not present

## 2017-06-04 DIAGNOSIS — M7502 Adhesive capsulitis of left shoulder: Secondary | ICD-10-CM | POA: Diagnosis not present

## 2017-06-08 DIAGNOSIS — M7502 Adhesive capsulitis of left shoulder: Secondary | ICD-10-CM | POA: Diagnosis not present

## 2017-06-12 DIAGNOSIS — M7502 Adhesive capsulitis of left shoulder: Secondary | ICD-10-CM | POA: Diagnosis not present

## 2017-06-13 DIAGNOSIS — M7502 Adhesive capsulitis of left shoulder: Secondary | ICD-10-CM | POA: Diagnosis not present

## 2017-06-14 DIAGNOSIS — M7502 Adhesive capsulitis of left shoulder: Secondary | ICD-10-CM | POA: Diagnosis not present

## 2017-06-20 DIAGNOSIS — M7502 Adhesive capsulitis of left shoulder: Secondary | ICD-10-CM | POA: Diagnosis not present

## 2017-06-21 DIAGNOSIS — M7502 Adhesive capsulitis of left shoulder: Secondary | ICD-10-CM | POA: Diagnosis not present

## 2017-06-26 DIAGNOSIS — M7502 Adhesive capsulitis of left shoulder: Secondary | ICD-10-CM | POA: Diagnosis not present

## 2017-06-29 DIAGNOSIS — M7502 Adhesive capsulitis of left shoulder: Secondary | ICD-10-CM | POA: Diagnosis not present

## 2017-07-02 DIAGNOSIS — M7502 Adhesive capsulitis of left shoulder: Secondary | ICD-10-CM | POA: Diagnosis not present

## 2017-07-05 DIAGNOSIS — M7502 Adhesive capsulitis of left shoulder: Secondary | ICD-10-CM | POA: Diagnosis not present

## 2017-07-13 DIAGNOSIS — M7502 Adhesive capsulitis of left shoulder: Secondary | ICD-10-CM | POA: Diagnosis not present

## 2017-07-20 DIAGNOSIS — M7502 Adhesive capsulitis of left shoulder: Secondary | ICD-10-CM | POA: Diagnosis not present

## 2017-07-27 DIAGNOSIS — M7502 Adhesive capsulitis of left shoulder: Secondary | ICD-10-CM | POA: Diagnosis not present

## 2017-07-31 ENCOUNTER — Telehealth: Payer: Self-pay | Admitting: Family Medicine

## 2017-07-31 MED ORDER — AMPHETAMINE-DEXTROAMPHET ER 5 MG PO CP24: 5.0000 mg | ORAL_CAPSULE | Freq: Every day | ORAL | 0 refills | Status: DC

## 2017-07-31 NOTE — Telephone Encounter (Signed)
Done

## 2017-07-31 NOTE — Telephone Encounter (Signed)
Last OV 02/07/2017   Last refilled 04/05/2017 disp 30 with no refills   Sent to PCP for approval

## 2017-07-31 NOTE — Telephone Encounter (Unsigned)
Copied from [place] [id number]. Topic: Quick Communication - Rx Refill/Question >> Jul 31, 2017 12:09 PM Judyann Munson wrote: Medication: amphetamine-dextroamphetamine (ADDERALL XR) 5 MG 24 hr capsule  Has the patient contacted their pharmacy?  No   Preferred Pharmacy (with phone number or street name): [id number] [place], [place] [phone] (Phone) [phone] (Fax)      Agent: Please be advised that RX refills may take up to 3 business days. We ask that you follow-up with your pharmacy.

## 2017-08-01 NOTE — Telephone Encounter (Signed)
Called and spoke with pt. Pt advised and voiced understanding.  

## 2017-08-03 DIAGNOSIS — M7502 Adhesive capsulitis of left shoulder: Secondary | ICD-10-CM | POA: Diagnosis not present

## 2017-08-09 DIAGNOSIS — M7502 Adhesive capsulitis of left shoulder: Secondary | ICD-10-CM | POA: Diagnosis not present

## 2017-09-23 DIAGNOSIS — Z23 Encounter for immunization: Secondary | ICD-10-CM | POA: Diagnosis not present

## 2017-11-21 ENCOUNTER — Other Ambulatory Visit: Payer: Self-pay | Admitting: Family Medicine

## 2017-11-21 NOTE — Telephone Encounter (Signed)
Copied from [place] [id number]. Topic: Quick Communication - See Telephone Encounter >> Nov 21, 2017 10:41 AM Ivar Drape wrote: CRM for notification. See Telephone encounter for: 11/21/17. Patient would like a refill on his amphetamine-dextroamphetamine (ADDERALL XR) 5 MG 24 hr capsule medication and have it [facility].

## 2017-11-21 NOTE — Telephone Encounter (Signed)
Requested medication (s) are due for refill today:   Yes  Requested medication (s) are on the active medication list:   Yes  Future visit scheduled:   Yes 02/08/18 with Dr. Sarajane Jews   Last ordered: 10/01/17  #30  0 refills   Ends:  10/31/17   Requested Prescriptions  Pending Prescriptions Disp Refills   amphetamine-dextroamphetamine (ADDERALL XR) 5 MG 24 hr capsule 30 capsule 0    Sig: Take 1 capsule (5 mg total) by mouth daily.     Not Delegated - Psychiatry:  Stimulants/ADHD Failed - 11/21/2017 10:44 AM      Failed - This refill cannot be delegated      Failed - Urine Drug Screen completed in last 360 days.      Failed - Valid encounter within last 3 months    Recent Outpatient Visits          9 months ago Essential hypertension   [facility] [employer], Ishmael Holter, MD   1 year ago Essential hypertension   [facility] [employer], Ishmael Holter, MD   1 year ago Essential hypertension   [facility] at [employer] [facility], Jory Ee, MD   2 years ago [place] at [employer] [facility], Jory Ee, MD   2 years ago [place] [facility] [place], MD      Future Appointments            In 2 months Sarajane Jews, Ishmael Holter, MD [employer] [facility] [place], [facility]

## 2017-11-27 MED ORDER — AMPHETAMINE-DEXTROAMPHET ER 5 MG PO CP24: 5.0000 mg | ORAL_CAPSULE | Freq: Every day | ORAL | 0 refills | Status: DC

## 2017-11-27 NOTE — Telephone Encounter (Signed)
Done

## 2017-11-27 NOTE — Telephone Encounter (Signed)
Dr. Fry please advise on refills. Thanks  

## 2018-02-08 ENCOUNTER — Encounter: Payer: Self-pay | Admitting: Family Medicine

## 2018-02-08 ENCOUNTER — Ambulatory Visit (INDEPENDENT_AMBULATORY_CARE_PROVIDER_SITE_OTHER): Payer: Medicare Other | Admitting: Family Medicine

## 2018-02-08 VITALS — BP 120/70 | HR 56 | Temp 98.6°F | Ht 68.0 in | Wt 182.4 lb

## 2018-02-08 DIAGNOSIS — F900 Attention-deficit hyperactivity disorder, predominantly inattentive type: Secondary | ICD-10-CM | POA: Diagnosis not present

## 2018-02-08 DIAGNOSIS — I10 Essential (primary) hypertension: Secondary | ICD-10-CM

## 2018-02-08 DIAGNOSIS — N401 Enlarged prostate with lower urinary tract symptoms: Secondary | ICD-10-CM | POA: Diagnosis not present

## 2018-02-08 DIAGNOSIS — E7800 Pure hypercholesterolemia, unspecified: Secondary | ICD-10-CM | POA: Diagnosis not present

## 2018-02-08 DIAGNOSIS — J439 Emphysema, unspecified: Secondary | ICD-10-CM

## 2018-02-08 DIAGNOSIS — F17200 Nicotine dependence, unspecified, uncomplicated: Secondary | ICD-10-CM | POA: Diagnosis not present

## 2018-02-08 DIAGNOSIS — N138 Other obstructive and reflux uropathy: Secondary | ICD-10-CM | POA: Diagnosis not present

## 2018-02-08 DIAGNOSIS — Z23 Encounter for immunization: Secondary | ICD-10-CM

## 2018-02-08 MED ORDER — VARENICLINE TARTRATE 0.5 MG X 11 & 1 MG X 42 PO MISC: ORAL | 0 refills | Status: DC

## 2018-02-08 MED ORDER — DOXAZOSIN MESYLATE 8 MG PO TABS: 8.0000 mg | ORAL_TABLET | Freq: Every morning | ORAL | 3 refills | Status: DC

## 2018-02-08 MED ORDER — LOSARTAN POTASSIUM 50 MG PO TABS: 50.0000 mg | ORAL_TABLET | Freq: Every day | ORAL | 3 refills | Status: DC

## 2018-02-08 MED ORDER — VARENICLINE TARTRATE 1 MG PO TABS: 1.0000 mg | ORAL_TABLET | Freq: Two times a day (BID) | ORAL | 1 refills | Status: DC

## 2018-02-08 MED ORDER — SERTRALINE HCL 50 MG PO TABS: 50.0000 mg | ORAL_TABLET | Freq: Every day | ORAL | 3 refills | Status: DC

## 2018-02-08 MED ORDER — HYDROCHLOROTHIAZIDE 12.5 MG PO CAPS: 12.5000 mg | ORAL_CAPSULE | Freq: Every day | ORAL | 3 refills | Status: DC

## 2018-02-08 NOTE — Progress Notes (Signed)
   Subjective:    Patient ID: Bobby Cuevas, male    DOB: [birth year], 68 y.o.   MRN: [id number]  HPI Here to follow up on issues. He feels well. He wants to quit smoking and he wants to try Chantix again. His BP is stable. His anxiety and ADHD are well controlled. The COPD remains mild and he denies any particular SOB.     Review of Systems  Constitutional: Negative.   HENT: Negative.   Eyes: Negative.   Respiratory: Negative.   Cardiovascular: Negative.   Gastrointestinal: Negative.   Genitourinary: Negative.   Musculoskeletal: Negative.   Skin: Negative.   Neurological: Negative.   Psychiatric/Behavioral: Negative.        Objective:   Physical Exam Constitutional:      General: He is not in acute distress.    Appearance: He is well-developed. He is not diaphoretic.  HENT:     Head: Normocephalic and atraumatic.     Right Ear: External ear normal.     Left Ear: External ear normal.     Nose: Nose normal.     Mouth/Throat:     Pharynx: No oropharyngeal exudate.  Eyes:     General: No scleral icterus.       Right eye: No discharge.        Left eye: No discharge.     Conjunctiva/sclera: Conjunctivae normal.     Pupils: Pupils are equal, round, and reactive to light.  Neck:     Musculoskeletal: Neck supple.     Thyroid: No thyromegaly.     Vascular: No JVD.     Trachea: No tracheal deviation.  Cardiovascular:     Rate and Rhythm: Normal rate and regular rhythm.     Heart sounds: Normal heart sounds. No murmur. No friction rub. No gallop.   Pulmonary:     Effort: Pulmonary effort is normal. No respiratory distress.     Breath sounds: Normal breath sounds. No wheezing or rales.  Chest:     Chest wall: No tenderness.  Abdominal:     General: Bowel sounds are normal. There is no distension.     Palpations: Abdomen is soft. There is no mass.     Tenderness: There is no abdominal tenderness. There is no guarding or rebound.  Genitourinary:    Penis: Normal. No  tenderness.      Prostate: Normal.     Rectum: Normal. Guaiac result negative.  Musculoskeletal: Normal range of motion.        General: No tenderness.  Lymphadenopathy:     Cervical: No cervical adenopathy.  Skin:    General: Skin is warm and dry.     Coloration: Skin is not pale.     Findings: No erythema or rash.  Neurological:     Mental Status: He is alert and oriented to person, place, and time.     Cranial Nerves: No cranial nerve deficit.     Motor: No abnormal muscle tone.     Coordination: Coordination normal.     Deep Tendon Reflexes: Reflexes are normal and symmetric. Reflexes normal.  Psychiatric:        Behavior: Behavior normal.        Thought Content: Thought content normal.        Judgment: Judgment normal.           Assessment & Plan:  He is doing well. Medications were refilled. Get fasting labs soon. Try Chantix.  [name], MD

## 2018-02-08 NOTE — Addendum Note (Signed)
Addended by: Elie Confer on: 02/08/2018 11:27 AM   Modules accepted: Orders

## 2018-02-11 ENCOUNTER — Other Ambulatory Visit: Payer: Medicare Other

## 2018-03-13 ENCOUNTER — Ambulatory Visit (INDEPENDENT_AMBULATORY_CARE_PROVIDER_SITE_OTHER)
Admission: RE | Admit: 2018-03-13 | Discharge: 2018-03-13 | Disposition: A | Discharge status: Home / Self Care | Payer: Medicare Other | Source: Ambulatory Visit | Attending: Acute Care | Admitting: Acute Care

## 2018-03-13 DIAGNOSIS — F17210 Nicotine dependence, cigarettes, uncomplicated: Secondary | ICD-10-CM

## 2018-03-13 DIAGNOSIS — Z87891 Personal history of nicotine dependence: Secondary | ICD-10-CM | POA: Diagnosis not present

## 2018-03-13 DIAGNOSIS — Z122 Encounter for screening for malignant neoplasm of respiratory organs: Secondary | ICD-10-CM

## 2018-03-18 ENCOUNTER — Other Ambulatory Visit: Payer: Self-pay | Admitting: Acute Care

## 2018-03-18 DIAGNOSIS — Z87891 Personal history of nicotine dependence: Secondary | ICD-10-CM

## 2018-03-18 DIAGNOSIS — F17210 Nicotine dependence, cigarettes, uncomplicated: Principal | ICD-10-CM

## 2018-03-18 DIAGNOSIS — Z122 Encounter for screening for malignant neoplasm of respiratory organs: Secondary | ICD-10-CM

## 2018-03-22 IMAGING — CT CT CHEST LUNG CANCER SCREENING LOW DOSE W/O CM
2 of 4 series · 15 of 40 positions shown, 18 images · non-contrast
Comparison: None.

CLINICAL DATA: 65-year-old male with 40 pack-year history of
smoking. Lung cancer screening.

EXAM:
CT CHEST WITHOUT CONTRAST LOW-DOSE FOR LUNG CANCER SCREENING
TECHNIQUE: Multidetector CT imaging of the chest was performed following the
standard protocol without IV contrast.

[Series 2: thorax 5.0 i31f 3 · axial · 0.75mm/px · z∈[-310,-45]mm · 12 of 63 slices shown, 15 images]
[im 5/63  mediastinal]
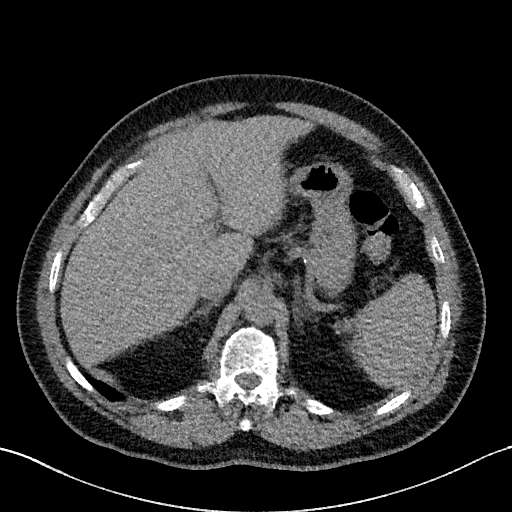
[im 5/63  lung]
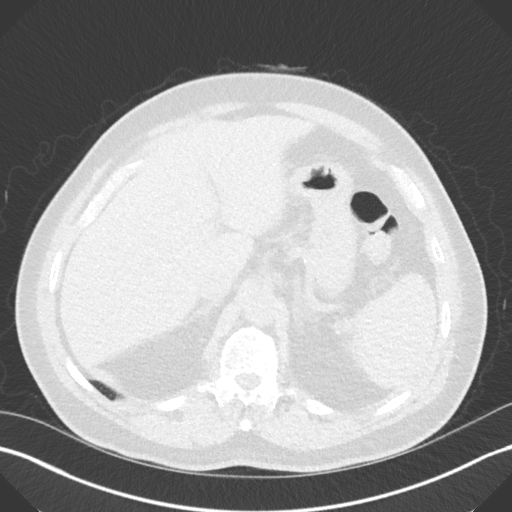
[im 10/63  lung]
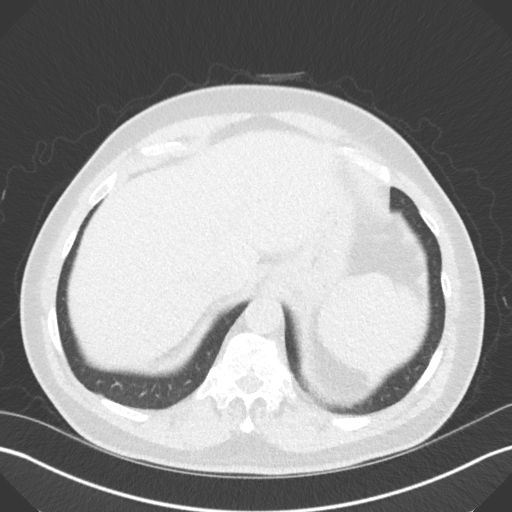
[im 15/63  lung]
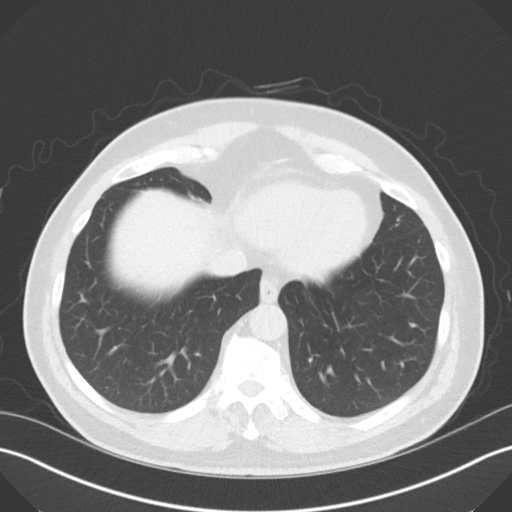
[im 20/63  lung]
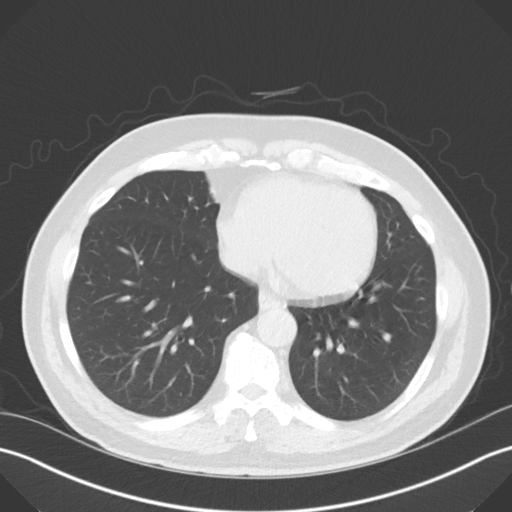
[im 24/63  mediastinal]
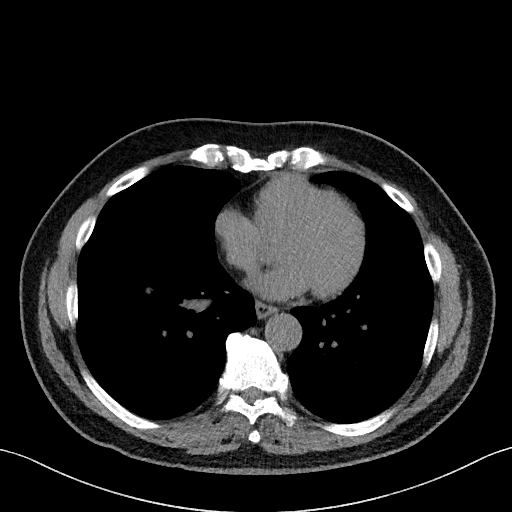
[im 24/63  lung]
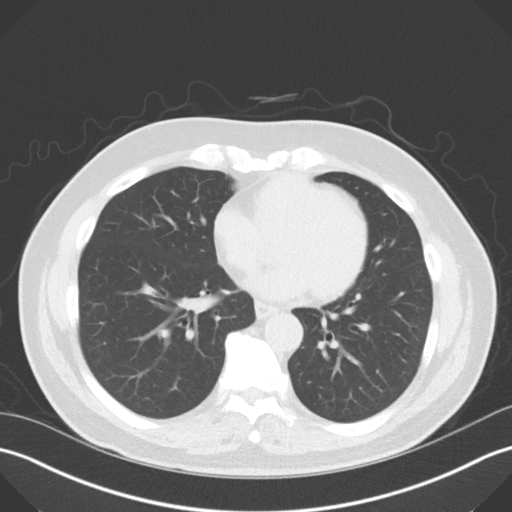
[im 29/63  lung]
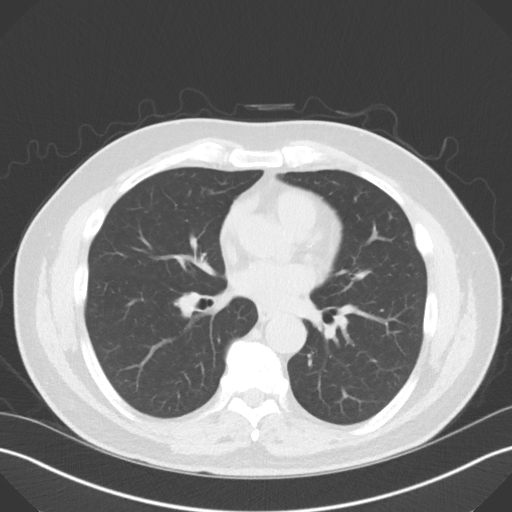
[im 34/63  lung]
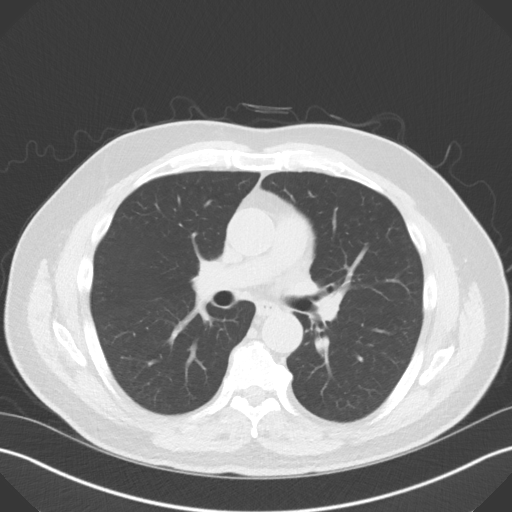
[im 39/63  lung]
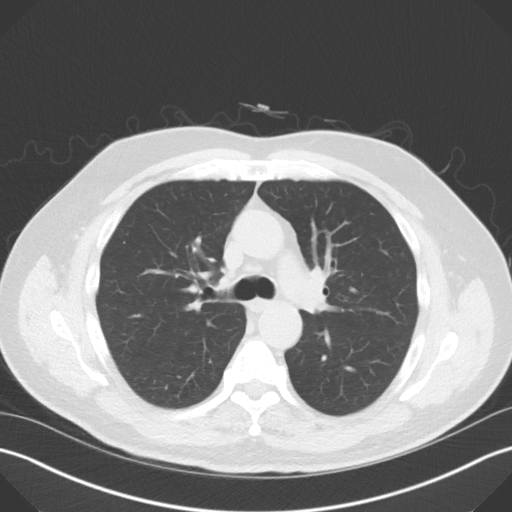
[im 43/63  mediastinal]
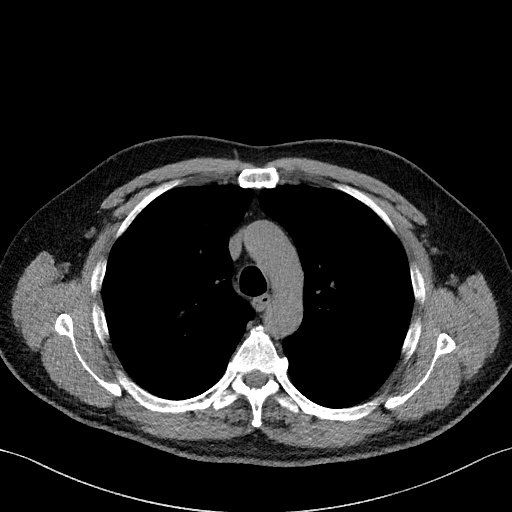
[im 43/63  lung]
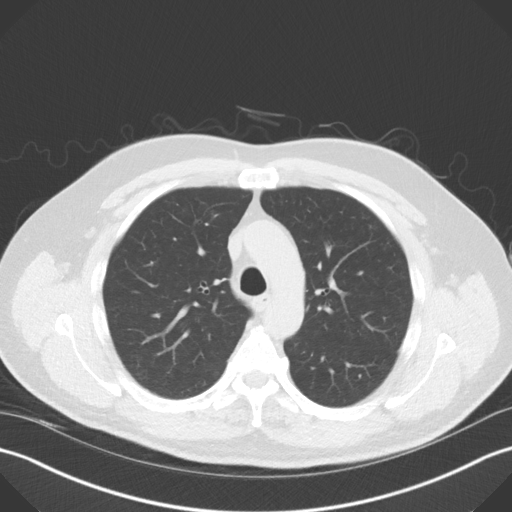
[im 48/63  lung]
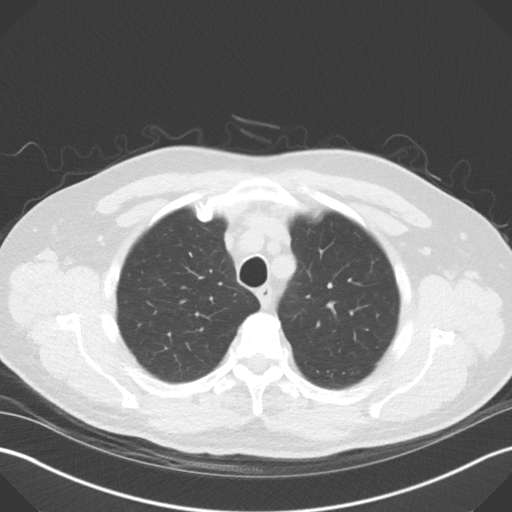
[im 53/63  lung]
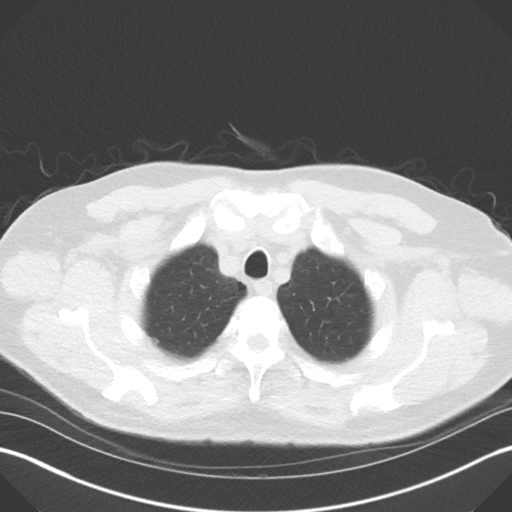
[im 58/63  lung]
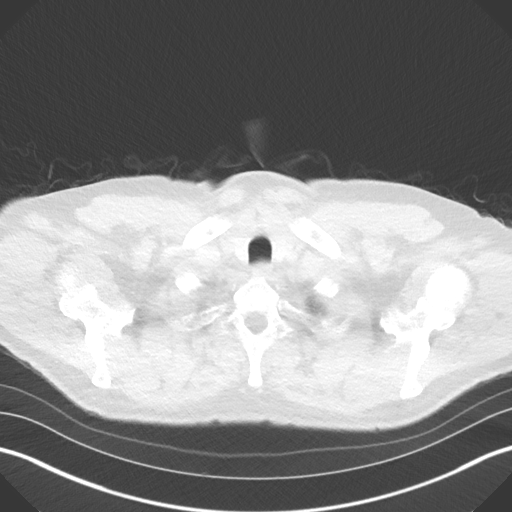

[Series 5: coronal · coronal · 0.66mm/px · 3 of 126 slices shown]
[im 26/126  lung]
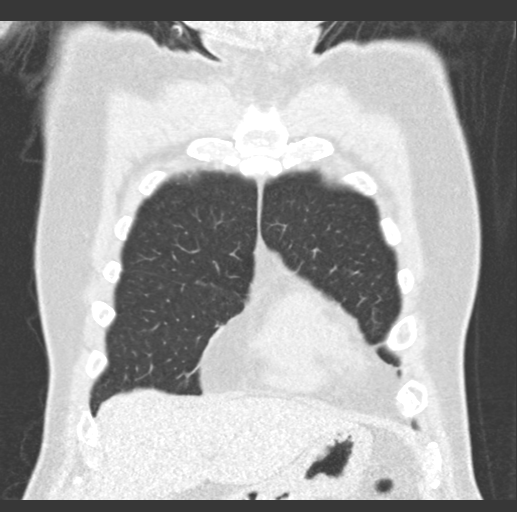
[im 51/126  lung]
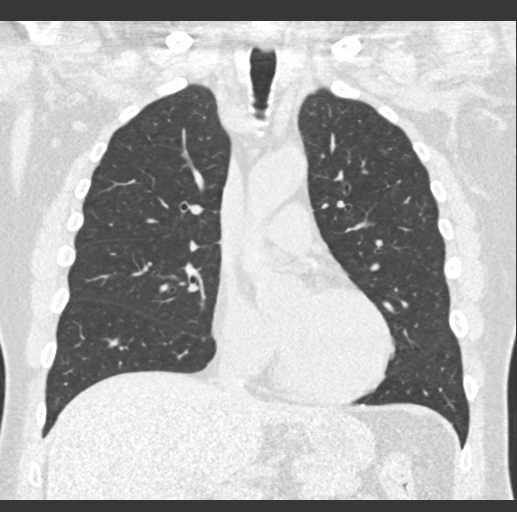
[im 76/126  lung]
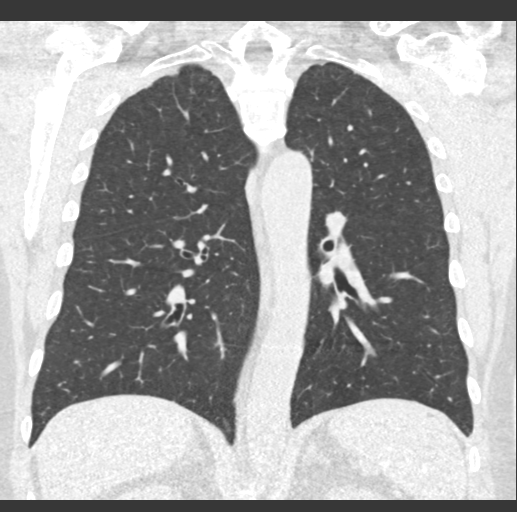

[15 of 40 positions shown; findings below may reference images not displayed]

FINDINGS: Cardiovascular: The heart size is normal. No pericardial effusion.
Coronary artery calcification is noted. Atherosclerotic
calcification is noted in the wall of the thoracic aorta.

Mediastinum/Nodes: No mediastinal lymphadenopathy. No evidence for
gross hilar lymphadenopathy although assessment is limited by the
lack of intravenous contrast on today's study. The esophagus has
normal imaging features. There is no axillary lymphadenopathy.

Lungs/Pleura: Emphysema noted with associated bronchial wall
thickening. Scattered tiny pulmonary nodules are evident. No focal
airspace consolidation. No pulmonary edema or pleural effusion.

Upper Abdomen: Unremarkable.

Musculoskeletal: Bone windows reveal no worrisome lytic or sclerotic
osseous lesions.
IMPRESSION: 1. Lung-RADS Category 2, benign appearance or behavior. Continue
annual screening with low-dose chest CT without contrast in 12
months.
2. Emphysema.
3. Coronary artery and thoracic aortic atherosclerosis.

## 2018-03-26 ENCOUNTER — Other Ambulatory Visit: Payer: Self-pay | Admitting: Family Medicine

## 2018-03-26 NOTE — Telephone Encounter (Signed)
Copied from [place] [id number]. Topic: Quick Communication - Rx Refill/Question >> Mar 26, 2018  1:11 PM Wynetta Emery, [place] C wrote: Medication: amphetamine-dextroamphetamine (ADDERALL XR) 5 MG 24 hr capsule   Has the patient contacted their pharmacy? Yes  (Agent: If no, request that the patient contact the pharmacy for the refill.) (Agent: If yes, when and what did the pharmacy advise?)  Preferred Pharmacy (with phone number or street name): [id number] [place], [place] [phone] (Phone) [phone] (Fax)   Agent: Please be advised that RX refills may take up to 3 business days. We ask that you follow-up with your pharmacy.   Pt says that he is completely out of his medication.

## 2018-03-26 NOTE — Telephone Encounter (Signed)
Requested medication (s) are due for refill today: yes  Requested medication (s) are on the active medication list: yes  Last refill:  11/21/17  Future visit scheduled: no  Notes to clinic:  Not delegated    Requested Prescriptions  Pending Prescriptions Disp Refills   amphetamine-dextroamphetamine (ADDERALL XR) 5 MG 24 hr capsule 30 capsule 0    Sig: Take 1 capsule (5 mg total) by mouth daily for 30 days.     Not Delegated - Psychiatry:  Stimulants/ADHD Failed - 03/26/2018  1:16 PM      Failed - This refill cannot be delegated      Failed - Urine Drug Screen completed in last 360 days.      Passed - Valid encounter within last 3 months    Recent Outpatient Visits          1 month ago Essential hypertension   [place] [facility] [place], Ishmael Holter, MD   1 year ago Essential hypertension   Therapist, music at [employer], Ishmael Holter, MD   1 year ago Essential hypertension   Therapist, music at [employer], Ishmael Holter, MD   2 years ago Essential hypertension   Therapist, music at [employer], Jory Ee, MD   3 years ago [place] at [employer], Jory Ee, MD

## 2018-03-27 NOTE — Telephone Encounter (Signed)
Dr. Fry please advise on refill. Thanks  

## 2018-03-29 MED ORDER — AMPHETAMINE-DEXTROAMPHET ER 5 MG PO CP24: 5.0000 mg | ORAL_CAPSULE | Freq: Every day | ORAL | 0 refills | Status: DC

## 2018-03-29 NOTE — Telephone Encounter (Signed)
Done

## 2018-08-20 DIAGNOSIS — J069 Acute upper respiratory infection, unspecified: Secondary | ICD-10-CM | POA: Diagnosis not present

## 2018-08-20 DIAGNOSIS — Z20828 Contact with and (suspected) exposure to other viral communicable diseases: Secondary | ICD-10-CM | POA: Diagnosis not present

## 2018-08-20 DIAGNOSIS — W57XXXA Bitten or stung by nonvenomous insect and other nonvenomous arthropods, initial encounter: Secondary | ICD-10-CM | POA: Diagnosis not present

## 2018-08-20 DIAGNOSIS — R21 Rash and other nonspecific skin eruption: Secondary | ICD-10-CM | POA: Diagnosis not present

## 2018-08-20 DIAGNOSIS — L03211 Cellulitis of face: Secondary | ICD-10-CM | POA: Diagnosis not present

## 2018-08-22 DIAGNOSIS — I10 Essential (primary) hypertension: Secondary | ICD-10-CM | POA: Diagnosis not present

## 2018-08-22 DIAGNOSIS — R51 Headache: Secondary | ICD-10-CM | POA: Diagnosis not present

## 2018-08-22 DIAGNOSIS — Z79899 Other long term (current) drug therapy: Secondary | ICD-10-CM | POA: Diagnosis not present

## 2018-08-22 DIAGNOSIS — R509 Fever, unspecified: Secondary | ICD-10-CM | POA: Diagnosis not present

## 2018-08-22 DIAGNOSIS — F329 Major depressive disorder, single episode, unspecified: Secondary | ICD-10-CM | POA: Diagnosis not present

## 2018-08-22 DIAGNOSIS — B029 Zoster without complications: Secondary | ICD-10-CM | POA: Diagnosis not present

## 2018-08-22 DIAGNOSIS — R21 Rash and other nonspecific skin eruption: Secondary | ICD-10-CM | POA: Diagnosis not present

## 2018-08-22 DIAGNOSIS — L299 Pruritus, unspecified: Secondary | ICD-10-CM | POA: Diagnosis not present

## 2018-08-23 DIAGNOSIS — Z03818 Encounter for observation for suspected exposure to other biological agents ruled out: Secondary | ICD-10-CM | POA: Diagnosis not present

## 2018-08-23 DIAGNOSIS — H4389 Other disorders of vitreous body: Secondary | ICD-10-CM | POA: Diagnosis not present

## 2018-08-23 DIAGNOSIS — B0239 Other herpes zoster eye disease: Secondary | ICD-10-CM | POA: Diagnosis not present

## 2018-08-23 DIAGNOSIS — Z7189 Other specified counseling: Secondary | ICD-10-CM | POA: Diagnosis not present

## 2018-08-23 DIAGNOSIS — H2513 Age-related nuclear cataract, bilateral: Secondary | ICD-10-CM | POA: Diagnosis not present

## 2018-09-17 ENCOUNTER — Other Ambulatory Visit: Payer: Self-pay

## 2018-09-17 ENCOUNTER — Ambulatory Visit (INDEPENDENT_AMBULATORY_CARE_PROVIDER_SITE_OTHER): Payer: Medicare Other | Admitting: Family Medicine

## 2018-09-17 ENCOUNTER — Encounter: Payer: Self-pay | Admitting: Family Medicine

## 2018-09-17 DIAGNOSIS — B029 Zoster without complications: Secondary | ICD-10-CM | POA: Diagnosis not present

## 2018-09-17 NOTE — Progress Notes (Signed)
Subjective:    Patient ID: Bobby Cuevas, male    DOB: 07-10-1950, 68 y.o.   MRN: [id number]  HPI Virtual Visit via Video Note  I connected with the patient on 09/17/18 at  2:45 PM EDT by a video enabled telemedicine application and verified that I am speaking with the correct person using two identifiers.  Location patient: home Location provider:work or home office Persons participating in the virtual visit: patient, provider  I discussed the limitations of evaluation and management by telemedicine and the availability of in person appointments. The patient expressed understanding and agreed to proceed.   HPI: Here to follow up on shingles. About 3 weeks ago while he was in [place], [place] he devlooped a rash on the left side of the face that burned and was very painful. This involved the left cheek, the left ear, and the left anterior neck. It did not involve the eyes. He saw an urgent care there who diagnosed shingles, and he was given a course of Acyclovir 800 mg 5 times a day. Now he feels much better, and the rash has resolved except for a small scabbed area on the left cheek.    ROS: See pertinent positives and negatives per HPI.  Past Medical History:  Diagnosis Date  . Anxiety   . COPD (chronic obstructive pulmonary disease) ([place])    emphysema on CT   . Hyperlipidemia   . Hypertension   . Tobacco abuse     Past Surgical History:  Procedure Laterality Date  . APPENDECTOMY    . COLONOSCOPY  04/18/2017   per Dr. Ardis Hughs, adenomatous polyps, repeat in 5 yrs   . REDUCTION OF TORSION OF TESTIS  at age 46    Family History  Problem Relation Age of Onset  . Lung disease Father   . Lung cancer Father   . Alcohol abuse Other   . Hypertension Other   . Cancer Other   . Lung disease Other   . Colon cancer Neg Hx   . Esophageal cancer Neg Hx   . Stomach cancer Neg Hx   . Rectal cancer Neg Hx   . Liver cancer Neg Hx   . Pancreatic cancer Neg Hx   . Prostate  cancer Neg Hx      Current Outpatient Medications:  .  amphetamine-dextroamphetamine (ADDERALL XR) 5 MG 24 hr capsule, Take 1 capsule (5 mg total) by mouth daily for 30 days., Disp: 30 capsule, Rfl: 0 .  aspirin 81 MG tablet, Take 81 mg by mouth daily.  , Disp: , Rfl:  .  doxazosin (CARDURA) 8 MG tablet, Take 1 tablet (8 mg total) by mouth every morning., Disp: 90 tablet, Rfl: 3 .  hydrochlorothiazide (MICROZIDE) 12.5 MG capsule, Take 1 capsule (12.5 mg total) by mouth daily., Disp: 90 capsule, Rfl: 3 .  losartan (COZAAR) 50 MG tablet, Take 1 tablet (50 mg total) by mouth daily., Disp: 90 tablet, Rfl: 3 .  sertraline (ZOLOFT) 50 MG tablet, Take 1 tablet (50 mg total) by mouth daily., Disp: 90 tablet, Rfl: 3 .  varenicline (CHANTIX CONTINUING MONTH PAK) 1 MG tablet, Take 1 tablet (1 mg total) by mouth 2 (two) times daily., Disp: 56 tablet, Rfl: 1 .  varenicline (CHANTIX STARTING MONTH PAK) 0.5 MG X 11 & 1 MG X 42 tablet, Take one 0.5 mg tablet by mouth once daily for 3 days, then increase to one 0.5 mg tablet twice daily for 4 days, then increase to  one 1 mg tablet twice daily., Disp: 53 tablet, Rfl: 0  Current Facility-Administered Medications:  .  0.9 %  sodium chloride infusion, 500 mL, Intravenous, Once, Milus Banister, MD  EXAM:  VITALS per patient if applicable:  GENERAL: alert, oriented, appears well and in no acute distress  HEENT: atraumatic, conjunttiva clear, no obvious abnormalities on inspection of external nose and ears  NECK: normal movements of the head and neck  LUNGS: on inspection no signs of respiratory distress, breathing rate appears normal, no obvious gross SOB, gasping or wheezing  CV: no obvious cyanosis  MS: moves all visible extremities without noticeable abnormality  PSYCH/NEURO: pleasant and cooperative, no obvious depression or anxiety, speech and thought processing grossly intact  ASSESSMENT AND PLAN: Shingles, which is resolving. He will follow up  as needed. I suggested that he start the Shingrix series after a period of about 6 months.   [name], MD  Discussed the following assessment and plan:  No diagnosis found.     I discussed the assessment and treatment plan with the patient. The patient was provided an opportunity to ask questions and all were answered. The patient agreed with the plan and demonstrated an understanding of the instructions.   The patient was advised to call back or seek an in-person evaluation if the symptoms worsen or if the condition fails to improve as anticipated.     Review of Systems     Objective:   Physical Exam        Assessment & Plan:

## 2018-12-14 DIAGNOSIS — Z23 Encounter for immunization: Secondary | ICD-10-CM | POA: Diagnosis not present

## 2019-02-01 ENCOUNTER — Telehealth: Payer: Self-pay | Admitting: Family Medicine

## 2019-02-02 ENCOUNTER — Other Ambulatory Visit: Payer: Self-pay | Admitting: Family Medicine

## 2019-02-04 NOTE — Telephone Encounter (Signed)
Pt has scheduled CPE, requesting refill to cover until then please advise

## 2019-02-05 NOTE — Telephone Encounter (Signed)
Appointment scheduled for 02/27/2019 [facility] for temporary supply?

## 2019-02-05 NOTE — Telephone Encounter (Signed)
Yes, refill each for one month

## 2019-02-06 ENCOUNTER — Other Ambulatory Visit: Payer: Self-pay

## 2019-02-06 MED ORDER — DOXAZOSIN MESYLATE 8 MG PO TABS: 8.0000 mg | ORAL_TABLET | Freq: Every morning | ORAL | 0 refills | Status: DC

## 2019-02-06 MED ORDER — SERTRALINE HCL 50 MG PO TABS: 50.0000 mg | ORAL_TABLET | Freq: Every day | ORAL | 0 refills | Status: DC

## 2019-02-06 NOTE — Telephone Encounter (Signed)
Rx sent in. Patient is aware.  

## 2019-02-08 ENCOUNTER — Ambulatory Visit: Payer: Medicare Other | Attending: Internal Medicine

## 2019-02-08 DIAGNOSIS — Z23 Encounter for immunization: Secondary | ICD-10-CM

## 2019-02-08 NOTE — Progress Notes (Signed)
   Covid-19 Vaccination Clinic  Name:  AVERY MILLSTEIN    MRN: [id number] DOB: December 20, 1950  02/08/2019  Mr. Freyre was observed post Covid-19 immunization for 15 minutes without incidence. He was provided with Vaccine Information Sheet and instruction to access the V-Safe system.   Mr. Honea was instructed to call 911 with any severe reactions post vaccine: Marland Kitchen Difficulty breathing  . Swelling of your face and throat  . A fast heartbeat  . A bad rash all over your body  . Dizziness and weakness    Immunizations Administered    Name Date Dose VIS Date Route   Pfizer COVID-19 Vaccine 02/08/2019 12:32 PM 0.3 mL 12/27/2018 Intramuscular   Manufacturer: [place]   Lot: [id number]   [place]: [id number]

## 2019-02-26 ENCOUNTER — Other Ambulatory Visit: Payer: Self-pay

## 2019-02-27 ENCOUNTER — Ambulatory Visit (INDEPENDENT_AMBULATORY_CARE_PROVIDER_SITE_OTHER): Payer: Medicare Other | Admitting: Family Medicine

## 2019-02-27 ENCOUNTER — Encounter: Payer: Self-pay | Admitting: Family Medicine

## 2019-02-27 VITALS — BP 110/60 | HR 64 | Temp 97.8°F | Wt 180.4 lb

## 2019-02-27 DIAGNOSIS — N401 Enlarged prostate with lower urinary tract symptoms: Secondary | ICD-10-CM

## 2019-02-27 DIAGNOSIS — I10 Essential (primary) hypertension: Secondary | ICD-10-CM | POA: Diagnosis not present

## 2019-02-27 DIAGNOSIS — Z23 Encounter for immunization: Secondary | ICD-10-CM | POA: Diagnosis not present

## 2019-02-27 DIAGNOSIS — F419 Anxiety disorder, unspecified: Secondary | ICD-10-CM | POA: Diagnosis not present

## 2019-02-27 DIAGNOSIS — N138 Other obstructive and reflux uropathy: Secondary | ICD-10-CM

## 2019-02-27 DIAGNOSIS — J439 Emphysema, unspecified: Secondary | ICD-10-CM

## 2019-02-27 LAB — CBC WITH DIFFERENTIAL/PLATELET
Basophils Absolute: 0 10*3/uL (ref 0.0–0.1)
Basophils Relative: 0.5 % (ref 0.0–3.0)
Eosinophils Absolute: 0.2 10*3/uL (ref 0.0–0.7)
Eosinophils Relative: 2.2 % (ref 0.0–5.0)
HCT: 45.1 % (ref 39.0–52.0)
Hemoglobin: 15.4 g/dL (ref 13.0–17.0)
Lymphocytes Relative: 25.7 % (ref 12.0–46.0)
Lymphs Abs: 2 10*3/uL (ref 0.7–4.0)
MCHC: 34.1 g/dL (ref 30.0–36.0)
MCV: 99.3 fl (ref 78.0–100.0)
Monocytes Absolute: 0.5 10*3/uL (ref 0.1–1.0)
Monocytes Relative: 6.1 % (ref 3.0–12.0)
Neutro Abs: 5.1 10*3/uL (ref 1.4–7.7)
Neutrophils Relative %: 65.5 % (ref 43.0–77.0)
Platelets: 133 10*3/uL — ABNORMAL LOW (ref 150.0–400.0)
RBC: 4.54 Mil/uL (ref 4.22–5.81)
RDW: 12.2 % (ref 11.5–15.5)
WBC: 7.9 10*3/uL (ref 4.0–10.5)

## 2019-02-27 LAB — LIPID PANEL
Cholesterol: 182 mg/dL (ref 0–200)
HDL: 34 mg/dL — ABNORMAL LOW (ref >39.00)
LDL Cholesterol: 126 mg/dL — ABNORMAL HIGH (ref 0–99)
NonHDL: 148.26
Total CHOL/HDL Ratio: 5
Triglycerides: 110 mg/dL (ref 0.0–149.0)
VLDL: 22 mg/dL (ref 0.0–40.0)

## 2019-02-27 LAB — PSA: PSA: 0.97 ng/mL (ref 0.10–4.00)

## 2019-02-27 LAB — BASIC METABOLIC PANEL
BUN: 17 mg/dL (ref 6–23)
CO2: 28 mEq/L (ref 19–32)
Calcium: 8.9 mg/dL (ref 8.4–10.5)
Chloride: 107 mEq/L (ref 96–112)
Creatinine, Ser: 0.95 mg/dL (ref 0.40–1.50)
GFR: 79.04 mL/min (ref >60.00)
Glucose, Bld: 81 mg/dL (ref 70–99)
Potassium: 4 mEq/L (ref 3.5–5.1)
Sodium: 142 mEq/L (ref 135–145)

## 2019-02-27 LAB — HEPATIC FUNCTION PANEL
ALT: 14 U/L (ref 0–53)
AST: 13 U/L (ref 0–37)
Albumin: 4.2 g/dL (ref 3.5–5.2)
Alkaline Phosphatase: 66 U/L (ref 39–117)
Bilirubin, Direct: 0.1 mg/dL (ref 0.0–0.3)
Total Bilirubin: 0.6 mg/dL (ref 0.2–1.2)
Total Protein: 6.5 g/dL (ref 6.0–8.3)

## 2019-02-27 LAB — TSH: TSH: 2.02 u[IU]/mL (ref 0.35–4.50)

## 2019-02-27 NOTE — Addendum Note (Signed)
Addended by: Rebecca Eaton on: 02/27/2019 09:57 AM   Modules accepted: Orders

## 2019-02-27 NOTE — Progress Notes (Signed)
Subjective:    Patient ID: Bobby Cuevas, male    DOB: 04-Nov-1950, 69 y.o.   MRN: [id number]  HPI Here to follow up on issues. He feels well. He stopped taking Adderall last year because he felt he no longer needed it. His BP has been stable. His anxiety is stable. He has received the first Covid-19 vaccine and he is scheduled to get the second one.    Review of Systems  Constitutional: Negative.   HENT: Negative.   Eyes: Negative.   Respiratory: Negative.   Cardiovascular: Negative.   Gastrointestinal: Negative.   Genitourinary: Negative.   Musculoskeletal: Negative.   Skin: Negative.   Neurological: Negative.   Psychiatric/Behavioral: Negative.        Objective:   Physical Exam Constitutional:      General: He is not in acute distress.    Appearance: He is well-developed. He is not diaphoretic.  HENT:     Head: Normocephalic and atraumatic.     Right Ear: External ear normal.     Left Ear: External ear normal.     Nose: Nose normal.     Mouth/Throat:     Pharynx: No oropharyngeal exudate.  Eyes:     General: No scleral icterus.       Right eye: No discharge.        Left eye: No discharge.     Conjunctiva/sclera: Conjunctivae normal.     Pupils: Pupils are equal, round, and reactive to light.  Neck:     Thyroid: No thyromegaly.     Vascular: No JVD.     Trachea: No tracheal deviation.  Cardiovascular:     Rate and Rhythm: Normal rate and regular rhythm.     Heart sounds: Normal heart sounds. No murmur. No friction rub. No gallop.   Pulmonary:     Effort: Pulmonary effort is normal. No respiratory distress.     Breath sounds: Normal breath sounds. No wheezing or rales.  Chest:     Chest wall: No tenderness.  Abdominal:     General: Bowel sounds are normal. There is no distension.     Palpations: Abdomen is soft. There is no mass.     Tenderness: There is no abdominal tenderness. There is no guarding or rebound.  Genitourinary:    Penis: Normal. No  tenderness.      Testes: Normal.     Prostate: Normal.     Rectum: Normal. Guaiac result negative.  Musculoskeletal:        General: No tenderness. Normal range of motion.     Cervical back: Neck supple.  Lymphadenopathy:     Cervical: No cervical adenopathy.  Skin:    General: Skin is warm and dry.     Coloration: Skin is not pale.     Findings: No erythema or rash.  Neurological:     Mental Status: He is alert and oriented to person, place, and time.     Cranial Nerves: No cranial nerve deficit.     Motor: No abnormal muscle tone.     Coordination: Coordination normal.     Deep Tendon Reflexes: Reflexes are normal and symmetric. Reflexes normal.  Psychiatric:        Behavior: Behavior normal.        Thought Content: Thought content normal.        Judgment: Judgment normal.           Assessment & Plan:  His HTN and anxiety and ADHD are stable.  Get fasting labs. Given a pneumococcal 23 vaccine. He should be getting his yearly chest CT sometime soon. [name], MD

## 2019-03-01 ENCOUNTER — Ambulatory Visit: Payer: Medicare Other

## 2019-03-02 ENCOUNTER — Other Ambulatory Visit: Payer: Self-pay | Admitting: Family Medicine

## 2019-03-10 ENCOUNTER — Other Ambulatory Visit: Payer: Self-pay | Admitting: Family Medicine

## 2019-03-10 DIAGNOSIS — I10 Essential (primary) hypertension: Secondary | ICD-10-CM

## 2019-03-10 DIAGNOSIS — E7800 Pure hypercholesterolemia, unspecified: Secondary | ICD-10-CM

## 2019-03-13 ENCOUNTER — Other Ambulatory Visit: Payer: Self-pay | Admitting: Family Medicine

## 2019-03-17 ENCOUNTER — Ambulatory Visit
Admission: RE | Admit: 2019-03-17 | Discharge: 2019-03-17 | Disposition: A | Discharge status: Home / Self Care | Payer: Medicare Other | Source: Ambulatory Visit | Attending: Acute Care | Admitting: Acute Care

## 2019-03-17 ENCOUNTER — Other Ambulatory Visit: Payer: Self-pay

## 2019-03-17 ENCOUNTER — Ambulatory Visit: Payer: Medicare Other | Attending: Internal Medicine

## 2019-03-17 DIAGNOSIS — F17210 Nicotine dependence, cigarettes, uncomplicated: Secondary | ICD-10-CM | POA: Diagnosis not present

## 2019-03-17 DIAGNOSIS — Z122 Encounter for screening for malignant neoplasm of respiratory organs: Secondary | ICD-10-CM

## 2019-03-17 DIAGNOSIS — Z23 Encounter for immunization: Secondary | ICD-10-CM

## 2019-03-17 DIAGNOSIS — Z87891 Personal history of nicotine dependence: Secondary | ICD-10-CM

## 2019-03-17 NOTE — Progress Notes (Signed)
   Covid-19 Vaccination Clinic  Name:  Bobby Cuevas    MRN: [id number] DOB: 1950/08/25  03/17/2019  [name] was observed post Covid-19 immunization for 15 minutes without incidence. He was provided with Vaccine Information Sheet and instruction to access the V-Safe system.   [name] was instructed to call 911 with any severe reactions post vaccine: Marland Kitchen Difficulty breathing  . Swelling of your face and throat  . A fast heartbeat  . A bad rash all over your body  . Dizziness and weakness    Immunizations Administered    Name Date Dose VIS Date Route   Pfizer COVID-19 Vaccine 03/17/2019  1:01 PM 0.3 mL 12/27/2018 Intramuscular   Manufacturer: [place]   Lot: [id number]   [place]: [id number]

## 2019-03-20 NOTE — Progress Notes (Signed)
Please call patient and let them  know their  low dose Ct was read as a Lung RADS 2: nodules that are benign in appearance and behavior with a very low likelihood of becoming a clinically active cancer due to size or lack of growth. Recommendation per radiology is for a repeat LDCT in 12 months. .Please let them  know we will order and schedule their  annual screening scan for 03/2020. Please let them  know there was notation of CAD on their  scan.  Please remind the patient  that this is a non-gated exam therefore degree or severity of disease  cannot be determined. Please have them  follow up with their PCP regarding potential risk factor modification, dietary therapy or pharmacologic therapy if clinically indicated. Pt.  is not  currently on statin therapy. Please place order for annual  screening scan for  03/2020 and fax results to PCP. Thanks so much. 

## 2019-03-21 ENCOUNTER — Other Ambulatory Visit: Payer: Self-pay | Admitting: *Deleted

## 2019-03-21 DIAGNOSIS — F17210 Nicotine dependence, cigarettes, uncomplicated: Secondary | ICD-10-CM

## 2019-03-21 DIAGNOSIS — Z87891 Personal history of nicotine dependence: Secondary | ICD-10-CM

## 2019-04-03 ENCOUNTER — Other Ambulatory Visit: Payer: Self-pay | Admitting: Family Medicine

## 2019-04-06 ENCOUNTER — Other Ambulatory Visit: Payer: Self-pay | Admitting: Family Medicine

## 2019-04-28 DIAGNOSIS — K1329 Other disturbances of oral epithelium, including tongue: Secondary | ICD-10-CM | POA: Diagnosis not present

## 2019-04-30 ENCOUNTER — Other Ambulatory Visit: Payer: Self-pay | Admitting: Family Medicine

## 2019-05-17 ENCOUNTER — Other Ambulatory Visit: Payer: Self-pay | Admitting: Family Medicine

## 2019-08-17 ENCOUNTER — Other Ambulatory Visit: Payer: Self-pay | Admitting: Family Medicine

## 2019-09-10 ENCOUNTER — Other Ambulatory Visit: Payer: Self-pay | Admitting: Family Medicine

## 2019-10-16 ENCOUNTER — Other Ambulatory Visit: Payer: Self-pay

## 2019-10-16 ENCOUNTER — Ambulatory Visit (INDEPENDENT_AMBULATORY_CARE_PROVIDER_SITE_OTHER): Payer: Medicare Other

## 2019-10-16 DIAGNOSIS — Z0000 Encounter for general adult medical examination without abnormal findings: Secondary | ICD-10-CM | POA: Diagnosis not present

## 2019-10-16 NOTE — Progress Notes (Signed)
Subjective:   Bobby Cuevas is a 69 y.o. male who presents for an Initial Medicare Annual Wellness Visit.  I connected with Griselda Miner today by telephone and verified that I am speaking with the correct person using two identifiers. Location patient: home Location provider: work Persons participating in the virtual visit: patient, provider.   I discussed the limitations, risks, security and privacy concerns of performing an evaluation and management service by telephone and the availability of in person appointments. I also discussed with the patient that there may be a patient responsible charge related to this service. The patient expressed understanding and verbally consented to this telephonic visit.    Interactive audio and video telecommunications were attempted between this provider and patient, however failed, due to patient having technical difficulties OR patient did not have access to video capability.  We continued and completed visit with audio only.      Review of Systems    N/A Cardiac Risk Factors include: advanced age (>50men, >31 women);male gender;hypertension     Objective:    Today's Vitals   There is no height or weight on file to calculate BMI.  Advanced Directives 10/16/2019  Does Patient Have a Medical Advance Directive? Yes  Type of Paramedic of [place];Living will  Does patient want to make changes to medical advance directive? No - Patient declined  Copy of [place] in Chart? No - copy requested    Current Medications (verified) Outpatient Encounter Medications as of 10/16/2019  Medication Sig  . aspirin 81 MG tablet Take 81 mg by mouth daily.    Marland Kitchen doxazosin (CARDURA) 8 MG tablet TAKE 1 TABLET (8 MG TOTAL) BY MOUTH EVERY MORNING.  . hydrochlorothiazide (MICROZIDE) 12.5 MG capsule TAKE 1 CAPSULE BY MOUTH EVERY DAY  . losartan (COZAAR) 25 MG tablet TAKE 2 TABLETS BY MOUTH EVERY DAY  .  sertraline (ZOLOFT) 50 MG tablet TAKE 1 TABLET BY MOUTH EVERY DAY  . [DISCONTINUED] amphetamine-dextroamphetamine (ADDERALL XR) 5 MG 24 hr capsule Take 1 capsule (5 mg total) by mouth daily for 30 days.  . [DISCONTINUED] varenicline (CHANTIX CONTINUING MONTH PAK) 1 MG tablet Take 1 tablet (1 mg total) by mouth 2 (two) times daily.  . [DISCONTINUED] varenicline (CHANTIX STARTING MONTH PAK) 0.5 MG X 11 & 1 MG X 42 tablet Take one 0.5 mg tablet by mouth once daily for 3 days, then increase to one 0.5 mg tablet twice daily for 4 days, then increase to one 1 mg tablet twice daily.   Facility-Administered Encounter Medications as of 10/16/2019  Medication  . 0.9 %  sodium chloride infusion    Allergies (verified) Wellbutrin [bupropion]   History: Past Medical History:  Diagnosis Date  . Anxiety   . COPD (chronic obstructive pulmonary disease) ([place])    emphysema on CT   . Hyperlipidemia   . Hypertension   . Tobacco abuse    Past Surgical History:  Procedure Laterality Date  . APPENDECTOMY    . COLONOSCOPY  04/18/2017   per Dr. Ardis Hughs, adenomatous polyps, repeat in 5 yrs   . REDUCTION OF TORSION OF TESTIS  at age 14   Family History  Problem Relation Age of Onset  . Lung disease Father   . Lung cancer Father   . Alcohol abuse Other   . Hypertension Other   . Cancer Other   . Lung disease Other   . Colon cancer Neg Hx   . Esophageal cancer Neg  Hx   . Stomach cancer Neg Hx   . Rectal cancer Neg Hx   . Liver cancer Neg Hx   . Pancreatic cancer Neg Hx   . Prostate cancer Neg Hx    Social History   Socioeconomic History  . Marital status: Legally Separated    Spouse name: Not on file  . Number of children: Not on file  . Years of education: Not on file  . Highest education level: Not on file  Occupational History  . Not on file  Tobacco Use  . Smoking status: Current Every Day Smoker    Packs/day: 1.00    Years: 48.00    Pack years: 48.00    Types: Cigarettes  .  Smokeless tobacco: Never Used  . Tobacco comment: Counseled on smoking cessation  Vaping Use  . Vaping Use: Never used  Substance and Sexual Activity  . Alcohol use: Yes    Alcohol/week: 0.0 standard drinks    Comment: 1-2 beers per month per pt  . Drug use: No  . Sexual activity: Not on file  Other Topics Concern  . Not on file  Social History Narrative  . Not on file   Social Determinants of Health   Financial Resource Strain: Low Risk   . Difficulty of Paying Living Expenses: Not hard at all  Food Insecurity: No Food Insecurity  . Worried About Charity fundraiser in the Last Year: Never true  . Ran Out of Food in the Last Year: Never true  Transportation Needs: No Transportation Needs  . Lack of Transportation (Medical): No  . Lack of Transportation (Non-Medical): No  Physical Activity: Inactive  . Days of Exercise per Week: 0 days  . Minutes of Exercise per Session: 0 min  Stress: No Stress Concern Present  . Feeling of Stress : Not at all  Social Connections: Moderately Integrated  . Frequency of Communication with Friends and Family: More than three times a week  . Frequency of Social Gatherings with Friends and Family: More than three times a week  . Attends Religious Services: More than 4 times per year  . Active Member of Clubs or Organizations: Yes  . Attends Archivist Meetings: More than 4 times per year  . Marital Status: Separated    Tobacco Counseling Ready to quit: Not Answered Counseling given: Not Answered Comment: Counseled on smoking cessation   Clinical Intake:  Pre-visit preparation completed: Yes  Pain : No/denies pain     Nutritional Risks: None Diabetes: No  How often do you need to have someone help you when you read instructions, pamphlets, or other written materials from your doctor or pharmacy?: 1 - Never What is the last grade level you completed in school?: Bachelors Degree  Diabetic?No  Interpreter Needed?:  No  Information entered by :: [place] of Daily Living In your present state of health, do you have any difficulty performing the following activities: 10/16/2019  Hearing? Y  Comment Has to ask people to repeat themselves on occassion  Vision? N  Difficulty concentrating or making decisions? N  Walking or climbing stairs? N  Dressing or bathing? N  Doing errands, shopping? N  Preparing Food and eating ? N  Using the Toilet? N  In the past six months, have you accidently leaked urine? N  Do you have problems with loss of bowel control? N  Managing your Medications? N  Managing your Finances? N  Housekeeping or managing your Housekeeping?  N  Some recent data might be hidden    Patient Care Team: Laurey Morale, MD as PCP - General (Family Medicine)  Indicate any recent Medical Services you may have received from other than [facility] providers in the past year (date may be approximate).     Assessment:   This is a routine wellness examination for [place].  Hearing/Vision screen  Hearing Screening   125Hz  250Hz  500Hz  1000Hz  2000Hz  3000Hz  4000Hz  6000Hz  8000Hz   Right ear:           Left ear:           Vision Screening Comments: Patient states that usually gets eyes examined yearly skipped due to pandemic   Dietary issues and exercise activities discussed: Current Exercise Habits: The patient does not participate in regular exercise at present  Goals    . Exercise 3x per week (30 min per time)      Depression Screen PHQ 2/9 Scores 10/16/2019 02/08/2018 02/07/2017  PHQ - 2 Score 0 0 0  PHQ- 9 Score 0 - -    Fall Risk Fall Risk  10/16/2019 02/08/2018 02/07/2017  Falls in the past year? 0 0 No  Number falls in past yr: 0 - -  Injury with Fall? 0 - -  Risk for fall due to : No Fall Risks - -  Follow up Falls evaluation completed;Falls prevention discussed - -    Any stairs in or around the home? Yes  If so, are there any without handrails? No  Home free of loose  throw rugs in walkways, pet beds, electrical cords, etc? Yes  Adequate lighting in your home to reduce risk of falls? Yes   ASSISTIVE DEVICES UTILIZED TO PREVENT FALLS:  Life alert? No  Use of a cane, walker or w/c? No  Grab bars in the bathroom? No  Shower chair or bench in shower? Yes  Elevated toilet seat or a handicapped toilet? No     Cognitive Function:  Cognition within normal limits. Screening not indicated       Immunizations Immunization History  Administered Date(s) Administered  . Influenza Split 10/24/2010  . Influenza Whole 11/05/2007, 10/16/2009  . Influenza, High Dose Seasonal PF 02/07/2017, 09/23/2017, 12/14/2018  . Influenza,inj,Quad PF,6+ Mos 11/25/2012, 11/24/2013, 12/28/2014, 12/23/2015  . Influenza-Unspecified 09/23/2017  . PFIZER SARS-COV-2 Vaccination 02/08/2019, 03/17/2019  . Pneumococcal Conjugate-13 02/08/2018  . Pneumococcal Polysaccharide-23 02/27/2019  . Td 01/16/2001  . Tdap 12/24/2013    TDAP status: Up to date Flu Vaccine status: Declined, Education has been provided regarding the importance of this vaccine but patient still declined. Advised may receive this vaccine at local pharmacy or Health Dept. Aware to provide a copy of the vaccination record if obtained from local pharmacy or Health Dept. Verbalized acceptance and understanding. Pneumococcal vaccine status: Up to date Covid-19 vaccine status: Completed vaccines  Qualifies for Shingles Vaccine? Yes   Zostavax completed No   Shingrix Completed?: No.    Education has been provided regarding the importance of this vaccine. Patient has been advised to call insurance company to determine out of pocket expense if they have not yet received this vaccine. Advised may also receive vaccine at local pharmacy or Health Dept. Verbalized acceptance and understanding.  Screening Tests Health Maintenance  Topic Date Due  . Hepatitis C Screening  Never done  . INFLUENZA VACCINE  08/17/2019  .  COLONOSCOPY  04/19/2022  . TETANUS/TDAP  12/25/2023  . COVID-19 Vaccine  Completed  . PNA vac Low Risk  Adult  Completed    Health Maintenance  Health Maintenance Due  Topic Date Due  . Hepatitis C Screening  Never done  . INFLUENZA VACCINE  08/17/2019    Colorectal cancer screening: Completed 04/18/2017. Repeat every 10 years  Lung Cancer Screening: (Low Dose CT Chest recommended if Age 26-80 years, 30 pack-year currently smoking OR have quit w/in 15years.) does qualify.   Lung Cancer Screening Referral: N/A  Additional Screening:  Hepatitis C Screening: does qualify;   Vision Screening: Recommended annual ophthalmology exams for early detection of glaucoma and other disorders of the eye. Is the patient up to date with their annual eye exam?  No  Who is the provider or what is the name of the office in which the patient attends annual eye exams? [facility] [place] If pt is not established with a provider, would they like to be referred to a provider to establish care? No .   Dental Screening: Recommended annual dental exams for proper oral hygiene  Community Resource Referral / Chronic Care Management: CRR required this visit?  No   CCM required this visit?  No      Plan:     I have personally reviewed and noted the following in the patient's chart:   . Medical and social history . Use of alcohol, tobacco or illicit drugs  . Current medications and supplements . Functional ability and status . Nutritional status . Physical activity . Advanced directives . List of other physicians . Hospitalizations, surgeries, and ER visits in previous 12 months . Vitals . Screenings to include cognitive, depression, and falls . Referrals and appointments  In addition, I have reviewed and discussed with patient certain preventive protocols, quality metrics, and best practice recommendations. A written personalized care plan for preventive services as well as general  preventive health recommendations were provided to patient.     Ofilia Neas, LPN   08/26/5724   Nurse Notes: None

## 2019-10-16 NOTE — Patient Instructions (Signed)
Bobby Cuevas , Thank you for taking time to come for your Medicare Wellness Visit. I appreciate your ongoing commitment to your health goals. Please review the following plan we discussed and let me know if I can assist you in the future.   Screening recommendations/referrals: Colonoscopy: Up to date, next due 04/19/2027 Recommended yearly ophthalmology/optometry visit for glaucoma screening and checkup Recommended yearly dental visit for hygiene and checkup  Vaccinations: Influenza vaccine: Currently due, you may receive at your next office visit or at your local pharmacy Pneumococcal vaccine: Completed series Tdap vaccine: Up to date, next due 12/25/2023 Shingles vaccine: Currently due for Shingrix, you may receive this at your local pharmacy    Advanced directives: Please bring copies of your advanced medical directives so that we may scan them into your chart.  Conditions/risks identified: None   Next appointment: 03/02/2020 @ 9:00 am with Dr.Fry  Preventive Care 7 Years and Older, Male Preventive care refers to lifestyle choices and visits with your health care provider that can promote health and wellness. What does preventive care include?  A yearly physical exam. This is also called an annual well check.  Dental exams once or twice a year.  Routine eye exams. Ask your health care provider how often you should have your eyes checked.  Personal lifestyle choices, including:  Daily care of your teeth and gums.  Regular physical activity.  Eating a healthy diet.  Avoiding tobacco and drug use.  Limiting alcohol use.  Practicing safe sex.  Taking low doses of aspirin every day.  Taking vitamin and mineral supplements as recommended by your health care provider. What happens during an annual well check? The services and screenings done by your health care provider during your annual well check will depend on your age, overall health, lifestyle risk factors, and  family history of disease. Counseling  Your health care provider may ask you questions about your:  Alcohol use.  Tobacco use.  Drug use.  Emotional well-being.  Home and relationship well-being.  Sexual activity.  Eating habits.  History of falls.  Memory and ability to understand (cognition).  Work and work Statistician. Screening  You may have the following tests or measurements:  Height, weight, and BMI.  Blood pressure.  Lipid and cholesterol levels. These may be checked every 5 years, or more frequently if you are over 25 years old.  Skin check.  Lung cancer screening. You may have this screening every year starting at age 42 if you have a 30-pack-year history of smoking and currently smoke or have quit within the past 15 years.  Fecal occult blood test (FOBT) of the stool. You may have this test every year starting at age 6.  Flexible sigmoidoscopy or colonoscopy. You may have a sigmoidoscopy every 5 years or a colonoscopy every 10 years starting at age 29.  Prostate cancer screening. Recommendations will vary depending on your family history and other risks.  Hepatitis C blood test.  Hepatitis B blood test.  Sexually transmitted disease (STD) testing.  Diabetes screening. This is done by checking your blood sugar (glucose) after you have not eaten for a while (fasting). You may have this done every 1-3 years.  Abdominal aortic aneurysm (AAA) screening. You may need this if you are a current or former smoker.  Osteoporosis. You may be screened starting at age 76 if you are at high risk. Talk with your health care provider about your test results, treatment options, and if necessary, the need for  more tests. Vaccines  Your health care provider may recommend certain vaccines, such as:  Influenza vaccine. This is recommended every year.  Tetanus, diphtheria, and acellular pertussis (Tdap, Td) vaccine. You may need a Td booster every 10 years.  Zoster  vaccine. You may need this after age 62.  Pneumococcal 13-valent conjugate (PCV13) vaccine. One dose is recommended after age 79.  Pneumococcal polysaccharide (PPSV23) vaccine. One dose is recommended after age 70. Talk to your health care provider about which screenings and vaccines you need and how often you need them. This information is not intended to replace advice given to you by your health care provider. Make sure you discuss any questions you have with your health care provider. Document Released: 01/29/2015 Document Revised: 09/22/2015 Document Reviewed: 11/03/2014 Elsevier Interactive Patient Education  2017 [employer] Prevention in the Home Falls can cause injuries. They can happen to people of all ages. There are many things you can do to make your home safe and to help prevent falls. What can I do on the outside of my home?  Regularly fix the edges of walkways and driveways and fix any cracks.  Remove anything that might make you trip as you walk through a door, such as a raised step or threshold.  Trim any bushes or trees on the path to your home.  Use bright outdoor lighting.  Clear any walking paths of anything that might make someone trip, such as rocks or tools.  Regularly check to see if handrails are loose or broken. Make sure that both sides of any steps have handrails.  Any raised decks and porches should have guardrails on the edges.  Have any leaves, snow, or ice cleared regularly.  Use sand or salt on walking paths during winter.  Clean up any spills in your garage right away. This includes oil or grease spills. What can I do in the bathroom?  Use night lights.  Install grab bars by the toilet and in the tub and shower. Do not use towel bars as grab bars.  Use non-skid mats or decals in the tub or shower.  If you need to sit down in the shower, use a plastic, non-slip stool.  Keep the floor dry. Clean up any water that spills on the  floor as soon as it happens.  Remove soap buildup in the tub or shower regularly.  Attach bath mats securely with double-sided non-slip rug tape.  Do not have throw rugs and other things on the floor that can make you trip. What can I do in the bedroom?  Use night lights.  Make sure that you have a light by your bed that is easy to reach.  Do not use any sheets or blankets that are too big for your bed. They should not hang down onto the floor.  Have a firm chair that has side arms. You can use this for support while you get dressed.  Do not have throw rugs and other things on the floor that can make you trip. What can I do in the kitchen?  Clean up any spills right away.  Avoid walking on wet floors.  Keep items that you use a lot in easy-to-reach places.  If you need to reach something above you, use a strong step stool that has a grab bar.  Keep electrical cords out of the way.  Do not use floor polish or wax that makes floors slippery. If you must use wax, use non-skid  floor wax.  Do not have throw rugs and other things on the floor that can make you trip. What can I do with my stairs?  Do not leave any items on the stairs.  Make sure that there are handrails on both sides of the stairs and use them. Fix handrails that are broken or loose. Make sure that handrails are as long as the stairways.  Check any carpeting to make sure that it is firmly attached to the stairs. Fix any carpet that is loose or worn.  Avoid having throw rugs at the top or bottom of the stairs. If you do have throw rugs, attach them to the floor with carpet tape.  Make sure that you have a light switch at the top of the stairs and the bottom of the stairs. If you do not have them, ask someone to add them for you. What else can I do to help prevent falls?  Wear shoes that:  Do not have high heels.  Have rubber bottoms.  Are comfortable and fit you well.  Are closed at the toe. Do not wear  sandals.  If you use a stepladder:  Make sure that it is fully opened. Do not climb a closed stepladder.  Make sure that both sides of the stepladder are locked into place.  Ask someone to hold it for you, if possible.  Clearly mark and make sure that you can see:  Any grab bars or handrails.  First and last steps.  Where the edge of each step is.  Use tools that help you move around (mobility aids) if they are needed. These include:  Canes.  Walkers.  Scooters.  Crutches.  Turn on the lights when you go into a dark area. Replace any light bulbs as soon as they burn out.  Set up your furniture so you have a clear path. Avoid moving your furniture around.  If any of your floors are uneven, fix them.  If there are any pets around you, be aware of where they are.  Review your medicines with your doctor. Some medicines can make you feel dizzy. This can increase your chance of falling. Ask your doctor what other things that you can do to help prevent falls. This information is not intended to replace advice given to you by your health care provider. Make sure you discuss any questions you have with your health care provider. Document Released: 10/29/2008 Document Revised: 06/10/2015 Document Reviewed: 02/06/2014 Elsevier Interactive Patient Education  2017 [employer].

## 2019-10-23 ENCOUNTER — Telehealth: Payer: Self-pay | Admitting: Family Medicine

## 2019-10-23 NOTE — Progress Notes (Signed)
  Chronic Care Management   Note  10/23/2019 Name: HAKEEN SHIPES MRN: [id number] DOB: Nov 08, 1950  MABRY TIFT is a 69 y.o. year old male who is a primary care patient of Laurey Morale, MD. I reached out to Verdia Kuba by phone today in response to a referral sent by Mr. Hannan Hutmacher Pyon's PCP, Laurey Morale, MD.   Mr. Kidane was given information about Chronic Care Management services today including:  1. CCM service includes personalized support from designated clinical staff supervised by his physician, including individualized plan of care and coordination with other care providers 2. 24/7 contact phone numbers for assistance for urgent and routine care needs. 3. Service will only be billed when office clinical staff spend 20 minutes or more in a month to coordinate care. 4. Only one practitioner may furnish and bill the service in a calendar month. 5. The patient may stop CCM services at any time (effective at the end of the month) by phone call to the office staff.   Patient agreed to services and verbal consent obtained.   Follow up plan:   Carley Perdue [employer] Scheduler

## 2019-10-29 DIAGNOSIS — Z23 Encounter for immunization: Secondary | ICD-10-CM | POA: Diagnosis not present

## 2019-11-12 ENCOUNTER — Ambulatory Visit (INDEPENDENT_AMBULATORY_CARE_PROVIDER_SITE_OTHER): Payer: Medicare Other | Admitting: Family Medicine

## 2019-11-12 ENCOUNTER — Other Ambulatory Visit: Payer: Self-pay

## 2019-11-12 ENCOUNTER — Encounter: Payer: Self-pay | Admitting: Family Medicine

## 2019-11-12 VITALS — BP 118/60 | HR 69 | Temp 99.6°F | Wt 181.2 lb

## 2019-11-12 DIAGNOSIS — S70371A Other superficial bite of right thigh, initial encounter: Secondary | ICD-10-CM

## 2019-11-12 DIAGNOSIS — W540XXA Bitten by dog, initial encounter: Secondary | ICD-10-CM | POA: Diagnosis not present

## 2019-11-12 MED ORDER — AMOXICILLIN-POT CLAVULANATE 875-125 MG PO TABS: 1.0000 | ORAL_TABLET | Freq: Two times a day (BID) | ORAL | 0 refills | Status: AC

## 2019-11-12 NOTE — Patient Instructions (Addendum)
Animal Bite, Adult Animal bites range from mild to serious. An animal bite can result in any of these injuries:  A scratch.  A deep, open cut.  A puncture of the skin.  A crush injury.  Tearing away of the skin or a body part.  A bone injury. A small bite from a house pet is usually less serious than a bite from a stray or wild animal, such as a raccoon, fox, skunk, or bat. That is because stray and wild animals have a higher risk of carrying a serious infection called rabies, which can be passed to humans through a bite. What increases the risk? You are more likely to be bitten by an animal if:  You are around unfamiliar pets.  You disturb an animal when it is eating, sleeping, or caring for its babies.  You are outdoors in a place where small, wild animals roam freely. What are the signs or symptoms? Common symptoms of an animal bite include:  Pain.  Bleeding.  Swelling.  Bruising. How is this diagnosed? This condition may be diagnosed based on a physical exam and medical history. Your health care provider will examine your wound and ask for details about the animal and how the bite happened. You may also have tests, such as:  Blood tests to check for infection.  X-rays to check for damage to bones or joints.  Taking a fluid sample from your wound and checking it for infection (culture test). How is this treated? Treatment varies depending on the type of animal, where the bite is on your body, and your medical history. Treatment may include:  Caring for the wound. This often includes cleaning the wound, rinsing out (flushing) the wound with saline solution, and applying a bandage (dressing). In some cases, the wound may be closed with stitches (sutures), staples, skin glue, or adhesive strips.  Antibiotic medicine to prevent or treat infection. This medicine may be prescribed in pill or ointment form. If the bite area becomes infected, the medicine may be given through  an IV.  A tetanus shot to prevent tetanus infection.  Rabies treatment to prevent rabies infection. This will be done if the animal could have rabies.  Surgery. This may be done if a bite gets infected or if there is damage that needs to be repaired. Follow these instructions at home: Wound care   Follow instructions from your health care provider about how to take care of your wound. Make sure you: ? Wash your hands with soap and water before you change your dressing. If soap and water are not available, use hand sanitizer. ? Change your dressing as told by your health care provider. ? Leave sutures, skin glue, or adhesive strips in place. These skin closures may need to stay in place for 2 weeks or longer. If adhesive strip edges start to loosen and curl up, you may trim the loose edges. Do not remove adhesive strips completely unless your health care provider tells you to do that.  Check your wound every day for signs of infection. Check for: ? More redness, swelling, or pain. ? More fluid or blood. ? Warmth. ? Pus or a bad smell. Medicines  Take or apply over-the-counter and prescription medicines only as told by your health care provider.  If you were prescribed an antibiotic, take or apply it as told by your health care provider. Do not stop using the antibiotic even if your condition improves. General instructions   Keep the injured   area raised (elevated) above the level of your heart while you are sitting or lying down, if this is possible.  If directed, put ice on the injured area. ? Put ice in a plastic bag. ? Place a towel between your skin and the bag. ? Leave the ice on for 20 minutes, 2-3 times per day.  Keep all follow-up visits as told by your health care provider. This is important. Contact a health care provider if:  You have more redness, swelling, or pain around your wound.  Your wound feels warm to the touch.  You have a fever or chills.  You have a  general feeling of sickness (malaise).  You feel nauseous or you vomit.  You have pain that does not get better. Get help right away if:  You have a red streak that leads away from your wound.  You have non-clear fluid or more blood coming from your wound.  There is pus or a bad smell coming from your wound.  You have trouble moving your injured area.  You have numbness or tingling that extends beyond the wound. Summary  Animal bites can range from mild to serious. An animal bite can cause a scratch on the skin, a deep open cut, a puncture of the skin, a crush injury, tearing away of the skin or a body part, or a bone injury.  Your health care provider will examine your wound and ask for details about the animal and how the bite happened.  You may also have tests such as a blood test, X-ray, or testing of a fluid sample from your wound (culture test).  Treatment may include wound care, antibiotic medicine, a tetanus shot, and rabies treatment if the animal could have rabies. This information is not intended to replace advice given to you by your health care provider. Make sure you discuss any questions you have with your health care provider. Document Revised: 12/28/2016 Document Reviewed: 07/13/2016 Elsevier Patient Education  2020 Elsevier Inc.  

## 2019-11-12 NOTE — Progress Notes (Signed)
Subjective:    Patient ID: Bobby Cuevas, male    DOB: 26-Feb-1950, 69 y.o.   MRN: [id number]  No chief complaint on file.   HPI Pt is a 69 yo male with pmh sig for anxiety, COPD, HLD, HTN, history of tobacco use followed by Dr. Sarajane Jews who was seen for acute concern.  Patient endorses blood on R posterior thigh this morning after taking his neighbor's dogs back to their house after they got out.  Pt is unsure if he was bitten or scratched.  Pt does not have any tears in the jeans he was wearing this am.  Pt cleaned the area with alcohol.  Pt thinks the dogs are up to date on their immunizations as his neighbor works for a [employer], clinical.  Last Tdap 12/24/2013.  Past Medical History:  Diagnosis Date  . Anxiety   . COPD (chronic obstructive pulmonary disease) ([place])    emphysema on CT   . Hyperlipidemia   . Hypertension   . Tobacco abuse     Allergies  Allergen Reactions  . Wellbutrin [Bupropion] Hives    Rash on feet    ROS General: Denies fever, chills, night sweats, changes in weight, changes in appetite HEENT: Denies headaches, ear pain, changes in vision, rhinorrhea, sore throat CV: Denies CP, palpitations, SOB, orthopnea Pulm: Denies SOB, cough, wheezing GI: Denies abdominal pain, nausea, vomiting, diarrhea, constipation GU: Denies dysuria, hematuria, frequency, vaginal discharge Msk: Denies muscle cramps, joint pains Neuro: Denies weakness, numbness, tingling Skin: Denies rashes, bruising  +wound of posterior R leg. Psych: Denies depression, anxiety, hallucinations      Objective:    Blood pressure 118/60, pulse 69, temperature 99.6 F (37.6 C), temperature source Oral, weight 181 lb 3.2 oz (82.2 kg), SpO2 96 %.   Gen. Pleasant, well-nourished, in no distress, normal affect   HEENT: [place]/AT, face symmetric, conjunctiva clear, no scleral icterus, PERRLA, EOMI, nares patent without drainage Lungs: no accessory muscle use Cardiovascular: RRR, no peripheral  edema Musculoskeletal: No deformities, no cyanosis or clubbing, normal tone Neuro:  A&Ox3, CN II-XII intact, normal gait Skin:  Warm, dry, no rash  Linear abrasion with dried blood on posterior distal right thigh.  No holes or punctures in patient's jeans   Wt Readings from Last 3 Encounters:  02/27/19 180 lb 6.4 oz (81.8 kg)  02/08/18 182 lb 7 oz (82.8 kg)  04/18/17 182 lb (82.6 kg)    Lab Results  Component Value Date   WBC 7.9 02/27/2019   HGB 15.4 02/27/2019   HCT 45.1 02/27/2019   PLT 133.0 (L) 02/27/2019   GLUCOSE 81 02/27/2019   CHOL 182 02/27/2019   TRIG 110.0 02/27/2019   HDL 34.00 (L) 02/27/2019   LDLDIRECT 116.0 11/24/2013   LDLCALC 126 (H) 02/27/2019   ALT 14 02/27/2019   AST 13 02/27/2019   NA 142 02/27/2019   K 4.0 02/27/2019   CL 107 02/27/2019   CREATININE 0.95 02/27/2019   BUN 17 02/27/2019   CO2 28 02/27/2019   TSH 2.02 02/27/2019   PSA 0.97 02/27/2019    Assessment/Plan:  Dog bite, initial encounter  -Unable to determine bite versus abrasion from tooth or nail of dog as covered by dried blood.  Picture of area taken with patient's phone. -Patient advised to keep area clean and dry as well as monitoring for signs of infection -We will start Augmentin twice daily x7 days -Given handout -Tdap up-to-date.  Last given 12/24/2013. -Patient encouraged to verify rabies vaccines up-to-date  for neighbors dog. - Plan: amoxicillin-clavulanate (AUGMENTIN) 875-125 MG tablet  F/u prn  Grier Mitts, MD

## 2019-11-17 ENCOUNTER — Other Ambulatory Visit: Payer: Self-pay | Admitting: Family Medicine

## 2019-12-19 ENCOUNTER — Telehealth: Payer: Self-pay | Admitting: Pharmacist

## 2019-12-19 NOTE — Chronic Care Management (AMB) (Signed)
    Chronic Care Management Pharmacy Assistant   Name: Bobby Cuevas  MRN: [id number] DOB: 26-Jul-1950  Reason for Encounter: Medication Review/Initial Questions for Pharmacist visit on 12-22-2019  Patient Questions: 1. Have you seen any other providers since your last visit? No 2. Any changes in your medications or health? No 3. Any side effects from any medications? No 4. Do you have any symptoms or problems not managed by your medications? No 5. Any concerns about your health right now? No 6. Has your provider asked that you check blood pressure, blood sugar, or follow a special diet at home? No 7. Do you get any type of exercise regularly?  a. Walking  8. Can you think of a goal you would like to reach for your health?  a. Maintain health 9. Do you have any problems getting your medications? No 10. Is there anything that you would like to discuss during the appointment? No 11.  The patient was asked to please bring medications, blood pressure/ blood sugar log, and supplements to his appointment.   PCP : Laurey Morale, MD  Allergies:   Allergies  Allergen Reactions  . Wellbutrin [Bupropion] Hives    Rash on feet    Medications: Outpatient Encounter Medications as of 12/19/2019  Medication Sig  . aspirin 81 MG tablet Take 81 mg by mouth daily.    Marland Kitchen doxazosin (CARDURA) 8 MG tablet TAKE 1 TABLET (8 MG TOTAL) BY MOUTH EVERY MORNING.  . hydrochlorothiazide (MICROZIDE) 12.5 MG capsule TAKE 1 CAPSULE BY MOUTH EVERY DAY  . losartan (COZAAR) 25 MG tablet TAKE 2 TABLETS BY MOUTH EVERY DAY  . sertraline (ZOLOFT) 50 MG tablet TAKE 1 TABLET BY MOUTH EVERY DAY   Facility-Administered Encounter Medications as of 12/19/2019  Medication  . 0.9 %  sodium chloride infusion    Current Diagnosis: Patient Active Problem List   Diagnosis Date Noted  . Herpes zoster without complication 61/95/0932  . Anxiety disorder 02/07/2017  . COPD with emphysema ([place]) 07/05/2016  . Attention  deficit disorder 03/03/2009  . ERECTILE DYSFUNCTION, ORGANIC 01/18/2009  . TOBACCO ABUSE 11/05/2007  . Essential hypertension 10/01/2006    Goals Addressed   None     Follow-Up:  Coordination of Enhanced Pharmacy Services   Amilia ([place]) Mare Ferrari, [place] Assistant [phone]

## 2019-12-22 ENCOUNTER — Ambulatory Visit: Payer: Medicare Other | Admitting: Pharmacist

## 2019-12-22 DIAGNOSIS — I10 Essential (primary) hypertension: Secondary | ICD-10-CM

## 2019-12-22 DIAGNOSIS — F419 Anxiety disorder, unspecified: Secondary | ICD-10-CM

## 2019-12-22 NOTE — Chronic Care Management (AMB) (Signed)
Chronic Care Management Pharmacy  Name: Bobby Cuevas  MRN: [id number] DOB: Jul 16, 1950  Initial Planning Appointment: completed 12/19/19  Initial Questions: 1. Have you seen any other providers since your last visit? n/a 2. Any changes in your medicines or health? No   Chief Complaint/ HPI  Bobby Cuevas,  69 y.o. , male presents for their Initial CCM visit with the clinical pharmacist via telephone due to COVID-19 Pandemic.  PCP : Laurey Morale, MD  Their chronic conditions include: HTN, prevention of heart events, anxiety, tobacco abuse  Office Visits: -11/12/19 Grier Mitts, MD: Patient presented for dog bite. Prescribed Augmentin BID x 7 days.   -10/16/19 Ofilia Neas, LPN: Patient presented for medicare annual wellness exam.  -02/27/19 [name], MD: Patient presented for annual exam. Elevated LDL. No changes made.  Consult Visit: -05/02/19 Costella Hatcher (oral and maxillofacial pathology): Patient presented for unspecified lesions of oral mucosa. Unable to access notes.  Medications: Outpatient Encounter Medications as of 12/22/2019  Medication Sig  . aspirin 81 MG tablet Take 81 mg by mouth daily.  Marland Kitchen doxazosin (CARDURA) 8 MG tablet TAKE 1 TABLET (8 MG TOTAL) BY MOUTH EVERY MORNING.  . hydrochlorothiazide (MICROZIDE) 12.5 MG capsule TAKE 1 CAPSULE BY MOUTH EVERY DAY  . losartan (COZAAR) 25 MG tablet TAKE 2 TABLETS BY MOUTH EVERY DAY  . sertraline (ZOLOFT) 50 MG tablet TAKE 1 TABLET BY MOUTH EVERY DAY   Facility-Administered Encounter Medications as of 12/22/2019  Medication  . 0.9 %  sodium chloride infusion   Patient filed for [employer] in [place] and is planning on retiring at the end of the year. He currently lives with his dog and does not have any family in the area. He has lived in this area since the 4s.  He currently has a housekeeper come every 2 weeks to help with the cleaning and he does some of his own as well. He reports that he  is not a great cook and usually microwaves food. He also goes out to eat for lunch every day for 5 days of the week. He does some walking for exercise but reports he "could do more". He does some hiking when the weather is nice.   Patient sleeps pretty well and occasionally gets up early in the morning to go use the bathroom. Patient tries to go to bed at 10-11 and reads some and then goes to bed for about 7.5 hours. Patient feels rested during the day and takes naps occasionally.  Patient denies problems with driving and vision. Patient is due for an eye exam soon. Patient denies any problems with his current medications.   Current Diagnosis/Assessment:  Goals Addressed            This Visit's Progress   . Pharmacy Care Plan       CARE PLAN ENTRY (see longitudinal plan of care for additional care plan information)  Current Barriers:  . Chronic Disease Management support, education, and care coordination needs related to Hypertension, Hyperlipidemia, Anxiety, and Tobacco use   Hypertension BP Readings from Last 3 Encounters:  11/12/19 118/60  02/27/19 110/60  02/08/18 120/70   . Pharmacist Clinical Goal(s): o Over the next 180 days, patient will work with PharmD and providers to maintain BP goal <140/90 . Current regimen:  . Hydrochlorothiazide 12.5 mg 1 capsule daily  . Losartan 25 mg 1 tablet daily  . Doxazosin 8 mg 1 tablet daily  . Interventions: o Discussed DASH eating  plan recommendations: . Emphasizes vegetables, fruits, and whole-grains . Includes fat-free or low-fat dairy products, fish, poultry, beans, nuts, and vegetable oils . Limits foods that are high in saturated fat. These foods include fatty meats, full-fat dairy products, and tropical oils such as coconut, palm kernel, and palm oils. . Limits sugar-sweetened beverages and sweets . Limiting sodium intake to < 1500 mg/day o Discussed recommendations for moderate aerobic exercise for 150 minutes/week spread out  over 5 days for heart healthy lifestyle  . Patient self care activities - Over the next 180 days, patient will: o Check blood pressure weekly, document, and provide at future appointments o Ensure daily salt intake < 2300 mg/day  Prevention of heart events Lab Results  Component Value Date/Time   LDLCALC 126 (H) 02/27/2019 10:07 AM   LDLDIRECT 116.0 11/24/2013 10:09 AM   . Pharmacist Clinical Goal(s): o Over the next 180 days, patient will work with PharmD and providers to achieve LDL goal < 100 and prevent heart events . Current regimen:  o Aspirin 81 mg 1 tablet daily . Interventions: o Discussed lowering cholesterol through diet by: Marland Kitchen Limiting foods with cholesterol such as liver and other organ meats, egg yolks, shrimp, and whole milk dairy products . Avoiding saturated fats and trans fats and incorporating healthier fats, such as lean meat, nuts, and unsaturated oils like canola and olive oils . Eating foods with soluble fiber such as whole-grain cereals such as oatmeal and oat bran, fruits such as apples, bananas, oranges, pears, and prunes, legumes such as kidney beans, lentils, chick peas, black-eyed peas, and lima beans, and green leafy vegetables . Limiting alcohol intake o Monitoring for signs of bleeding such as unexplained and excessive bleeding from a cut or injury, easy or excessive bruising, blood in urine or stools, and nosebleeds without a known cause . Patient self care activities - Over the next 180 days, patient will: o Continue current medication  Anxiety . Pharmacist Clinical Goal(s): o Over the next 180 days, patient will work with PharmD and providers to manage symptoms of anxiety . Current regimen:  o Sertraline 50 mg 1 tablet daily . Patient self care activities - Over the next 180 days, patient will: o Continue current medication  Tobacco use . Pharmacist Clinical Goal(s) o Over the next 180 days, patient will work with PharmD and providers to consider  cutting back or quitting smoking tobacco . Current regimen:  o No medications . Interventions: o Provided contact information for [place] Quit Line (1-800-QUIT-NOW) and encouraged patient to reach out to this group for support. . Patient self care activities - Over the next 180 days, patient will: o Call the quit line to see what resources are offered  Medication management . Pharmacist Clinical Goal(s): o Over the next 180 days, patient will work with PharmD and providers to maintain optimal medication adherence . [facility] . Interventions o Comprehensive medication review performed. o Continue current medication management strategy . Patient self care activities - Over the next 180 days, patient will: o Take medications as prescribed o Report any questions or concerns to PharmD and/or provider(s)  Initial goal documentation       SDOH Interventions   Flowsheet Row Most Recent Value  SDOH Interventions   Financial Strain Interventions Intervention Not Indicated  Transportation Interventions Intervention Not Indicated      Hypertension   BP goal is:  <140/90  Office blood pressures are  BP Readings from Last 3 Encounters:  11/12/19 118/60  02/27/19 110/60  02/08/18 120/70   Patient checks BP at home never Patient home BP readings are ranging: n/a  Patient has failed these meds in the past: none Patient is currently controlled on the following medications:  . Hydrochlorothiazide 12.5 mg 1 capsule daily - in PM . Losartan 25 mg 1 tablet daily - in PM . Doxazosin 8 mg 1 tablet daily - in PM  We discussed diet and exercise extensively -Diet: DASH eating plan recommendations: . Emphasizes vegetables, fruits, and whole-grains . Includes fat-free or low-fat dairy products, fish, poultry, beans, nuts, and vegetable oils . Limits foods that are high in saturated fat. These foods include fatty meats, full-fat dairy products, and tropical oils such as coconut, palm  kernel, and palm oils. . Limits sugar-sweetened beverages and sweets . Limiting sodium intake to < 1500 mg/day -Discussed recommendations for moderate aerobic exercise for 150 minutes/week spread out over 5 days for heart healthy lifestyle  Plan Per ACC/AHA guidelines and to simply patient's regimen, recommend stopping doxazosin and increasing losartan to 50 mg daily. Could consider further titration beyond this.  Continue current medications   ASCVD prevention   LDL goal < 100  Last lipids Lab Results  Component Value Date   CHOL 182 02/27/2019   HDL 34.00 (L) 02/27/2019   LDLCALC 126 (H) 02/27/2019   LDLDIRECT 116.0 11/24/2013   TRIG 110.0 02/27/2019   CHOLHDL 5 02/27/2019   Hepatic Function Latest Ref Rng & Units 02/27/2019 02/07/2017 01/19/2016  Total Protein 6.0 - 8.3 g/dL 6.5 7.0 6.3  Albumin 3.5 - 5.2 g/dL 4.2 4.4 4.0  AST 0 - 37 U/L _0 ALT 0 - 53 U/L _1 Alk Phosphatase 39 - 117 U/L 66 68 62  Total Bilirubin 0.2 - 1.2 mg/dL 0.6 0.7 1.0  Bilirubin, Direct 0.0 - 0.3 mg/dL 0.1 0.1 0.2     The 10-year ASCVD risk score Mikey Bussing DC Jr., et al., 2013) is: 23%   Values used to calculate the score:     Age: 53 years     Sex: Male     Is Non-Hispanic African American: No     Diabetic: No     Tobacco smoker: Yes     Systolic Blood Pressure: 637 mmHg     Is BP treated: Yes     HDL Cholesterol: 34 mg/dL     Total Cholesterol: 182 mg/dL   Patient has failed these meds in past: none Patient is currently uncontrolled on the following medications:  . No medications  Plan Recommend starting moderate intensity statin based on LDL > 100 and ASCVD risk of 23%.  Anxiety   Patient has failed these meds in past: none Patient is currently controlled on the following medications:  . Sertraline 50 mg 1 tablet daily  We discussed:  Patient reports that it is working  Plan  Continue current medications   Tobacco Abuse   Tobacco Status:  Social History   Tobacco  Use  Smoking Status Current Every Day Smoker  . Packs/day: 1.00  . Years: 48.00  . Pack years: 48.00  . Types: Cigarettes  Smokeless Tobacco Never Used  Tobacco Comment   Counseled on smoking cessation   Patient currently smokes 1 PPD.  Patient smokes Within 30 minutes of waking Patient triggers include: anxiety and boredom  On a scale of 1-10, reports MOTIVATION to quit is 0 On a scale of 1-10, reports CONFIDENCE in quitting is 0  Previous quit attempts  included: none Patient is currently uncontrolled on the following medications:  . No medications  We discussed:  Provided contact information for [place] Quit Line (1-800-QUIT-NOW) and encouraged patient to reach out to this group for support.  Plan Plan to reassess at follow up.   Prevention of heart events   Patient has failed these meds in past: none Patient is currently controlled on the following medications:  . Aspirin 81 mg 1 tablet daily  We discussed:  Monitoring for signs of bleeding such as unexplained and excessive bleeding from a cut or injury, easy or excessive bruising, blood in urine or stools, and nosebleeds without a known cause; discussed the benefits of taking aspirin daily given ASCVD risk and tobacco use  Plan  Continue current medications  Vaccines   Reviewed and discussed patient's vaccination history.    Immunization History  Administered Date(s) Administered  . Influenza Split 10/24/2010  . Influenza Whole 11/05/2007, 10/16/2009  . Influenza, High Dose Seasonal PF 02/07/2017, 09/23/2017, 12/14/2018  . Influenza,inj,Quad PF,6+ Mos 11/25/2012, 11/24/2013, 12/28/2014, 12/23/2015  . Influenza-Unspecified 09/23/2017  . PFIZER SARS-COV-2 Vaccination 02/08/2019, 03/17/2019  . Pneumococcal Conjugate-13 02/08/2018  . Pneumococcal Polysaccharide-23 02/27/2019  . Td 01/16/2001  . Tdap 12/24/2013   Patient reports getting influenza vaccine at the pharmacy.   Plan  Recommended patient receive  Shingles vaccine in office/at pharmacy.   Medication Management   [facility]  [id number] [place] [address] [place]Phone: [phone]: [phone] Uses pill box? No - routine with nightly use Pt endorses 100% compliance  We discussed: Current pharmacy is preferred with insurance plan and patient is satisfied with pharmacy services  Plan  Continue current medication management strategy   Follow up: 6 month phone visit BP assessment with CPA sooner if BP medications are changed.  MJeni Salles PharmD BBig Sandy Medical CenterClinical Pharmacist [place]heridanat [place]

## 2020-01-05 NOTE — Patient Instructions (Addendum)
Hi Bobby Cuevas,  It was lovely to get to meet you! Below is a summary of some of the topics we discussed. Please start checking your blood pressure at home regularly to make sure your medications are working properly. Also, Dr. Sarajane Jews does want you to continue taking the aspirin daily as right now it is more beneficial for you to reduce your chances of having a heart attack or stroke. As a reminder, please look into getting the Shingles vaccine at your pharmacy to reduce your chances of getting shingles again.  Please give me a call if you have any questions or need anything before our follow up!  Best, Maddie  Jeni Salles, PharmD [facility] Clinical Pharmacist [place] [facility] [place]   Visit Information  Goals Addressed            This Visit's Progress   . Pharmacy Care Plan       CARE PLAN ENTRY (see longitudinal plan of care for additional care plan information)  Current Barriers:  . Chronic Disease Management support, education, and care coordination needs related to Hypertension, Hyperlipidemia, Anxiety, and Tobacco use   Hypertension BP Readings from Last 3 Encounters:  11/12/19 118/60  02/27/19 110/60  02/08/18 120/70   . Pharmacist Clinical Goal(s): o Over the next 180 days, patient will work with PharmD and providers to maintain BP goal <140/90 . Current regimen:  . Hydrochlorothiazide 12.5 mg 1 capsule daily  . Losartan 25 mg 1 tablet daily  . Doxazosin 8 mg 1 tablet daily  . Interventions: o Discussed DASH eating plan recommendations: . Emphasizes vegetables, fruits, and whole-grains . Includes fat-free or low-fat dairy products, fish, poultry, beans, nuts, and vegetable oils . Limits foods that are high in saturated fat. These foods include fatty meats, full-fat dairy products, and tropical oils such as coconut, palm kernel, and palm oils. . Limits sugar-sweetened beverages and sweets . Limiting sodium intake to < 1500 mg/day o Discussed  recommendations for moderate aerobic exercise for 150 minutes/week spread out over 5 days for heart healthy lifestyle  . Patient self care activities - Over the next 180 days, patient will: o Check blood pressure weekly, document, and provide at future appointments o Ensure daily salt intake < 2300 mg/day  Prevention of heart events Lab Results  Component Value Date/Time   LDLCALC 126 (H) 02/27/2019 10:07 AM   LDLDIRECT 116.0 11/24/2013 10:09 AM   . Pharmacist Clinical Goal(s): o Over the next 180 days, patient will work with PharmD and providers to achieve LDL goal < 100 and prevent heart events . Current regimen:  o Aspirin 81 mg 1 tablet daily . Interventions: o Discussed lowering cholesterol through diet by: Marland Kitchen Limiting foods with cholesterol such as liver and other organ meats, egg yolks, shrimp, and whole milk dairy products . Avoiding saturated fats and trans fats and incorporating healthier fats, such as lean meat, nuts, and unsaturated oils like canola and olive oils . Eating foods with soluble fiber such as whole-grain cereals such as oatmeal and oat bran, fruits such as apples, bananas, oranges, pears, and prunes, legumes such as kidney beans, lentils, chick peas, black-eyed peas, and lima beans, and green leafy vegetables . Limiting alcohol intake o Monitoring for signs of bleeding such as unexplained and excessive bleeding from a cut or injury, easy or excessive bruising, blood in urine or stools, and nosebleeds without a known cause . Patient self care activities - Over the next 180 days, patient will: o Continue current medication  Anxiety . Pharmacist Clinical Goal(s): o Over the next 180 days, patient will work with PharmD and providers to manage symptoms of anxiety . Current regimen:  o Sertraline 50 mg 1 tablet daily . Patient self care activities - Over the next 180 days, patient will: o Continue current medication  Tobacco use . Pharmacist Clinical  Goal(s) o Over the next 180 days, patient will work with PharmD and providers to consider cutting back or quitting smoking tobacco . Current regimen:  o No medications . Interventions: o Provided contact information for [place] [facility]uit Line (1-800-QUIT-NOW) and encouraged patient to reach out to this group for support. . Patient self care activities - Over the next 180 days, patient will: o Call the quit line to see what resources are offered  Medication management . Pharmacist Clinical Goal(s): o Over the next 180 days, patient will work with PharmD and providers to maintain optimal medication adherence . [facility] . Interventions o Comprehensive medication review performed. o Continue current medication management strategy . Patient self care activities - Over the next 180 days, patient will: o Take medications as prescribed o Report any questions or concerns to PharmD and/or provider(s)  Initial goal documentation        Bobby Cuevas was given information about Chronic Care Management services today including:  1. CCM service includes personalized support from designated clinical staff supervised by his physician, including individualized plan of care and coordination with other care providers 2. 24/7 contact phone numbers for assistance for urgent and routine care needs. 3. Standard insurance, coinsurance, copays and deductibles apply for chronic care management only during months in which we provide at least 20 minutes of these services. Most insurances cover these services at 100%, however patients may be responsible for any copay, coinsurance and/or deductible if applicable. This service may help you avoid the need for more expensive face-to-face services. 4. Only one practitioner may furnish and bill the service in a calendar month. 5. The patient may stop CCM services at any time (effective at the end of the month) by phone call to the office staff.  Patient agreed to  services and verbal consent obtained.   The patient verbalized understanding of instructions, educational materials, and care plan provided today and agreed to receive a mailed copy of patient instructions, educational materials, and care plan.  Telephone follow up appointment with pharmacy team member scheduled for: 6 months  Viona Gilmore, [facility]  How to Take Your Blood Pressure You can take your blood pressure at home with a machine. You may need to check your blood pressure at home:  To check if you have high blood pressure (hypertension).  To check your blood pressure over time.  To make sure your blood pressure medicine is working. Supplies needed: You will need a blood pressure machine, or monitor. You can buy one at a drugstore or online. When choosing one:  Choose one with an arm cuff.  Choose one that wraps around your upper arm. Only one finger should fit between your arm and the cuff.  Do not choose one that measures your blood pressure from your wrist or finger. Your doctor can suggest a monitor. How to prepare Avoid these things for 30 minutes before checking your blood pressure:  Drinking caffeine.  Drinking alcohol.  Eating.  Smoking.  Exercising. Five minutes before checking your blood pressure:  Pee.  Sit in a dining chair. Avoid sitting in a soft couch or armchair.  Be quiet. Do  not talk. How to take your blood pressure Follow the instructions that came with your machine. If you have a digital blood pressure monitor, these may be the instructions: 1. Sit up straight. 2. Place your feet on the floor. Do not cross your ankles or legs. 3. Rest your left arm at the level of your heart. You may rest it on a table, desk, or chair. 4. Pull up your shirt sleeve. 5. Wrap the blood pressure cuff around the upper part of your left arm. The cuff should be 1 inch (2.5 cm) above your elbow. It is best to wrap the cuff around bare skin. 6. Fit the cuff snugly  around your arm. You should be able to place only one finger between the cuff and your arm. 7. Put the cord inside the groove of your elbow. 8. Press the power button. 9. Sit quietly while the cuff fills with air and loses air. 10. Write down the numbers on the screen. 11. Wait 2-3 minutes and then repeat steps 1-10. What do the numbers mean? Two numbers make up your blood pressure. The first number is called systolic pressure. The second is called diastolic pressure. An example of a blood pressure reading is "120 over 80" (or 120/80). If you are an adult and do not have a medical condition, use this guide to find out if your blood pressure is normal: Normal  First number: below 120.  Second number: below 80. Elevated  First number: 120-129.  Second number: below 80. Hypertension stage 1  First number: 130-139.  Second number: 80-89. Hypertension stage 2  First number: 140 or above.  Second number: 15 or above. Your blood pressure is above normal even if only the top or bottom number is above normal. Follow these instructions at home:  Check your blood pressure as often as your doctor tells you to.  Take your monitor to your next doctor's appointment. Your doctor will: ? Make sure you are using it correctly. ? Make sure it is working right.  Make sure you understand what your blood pressure numbers should be.  Tell your doctor if your medicines are causing side effects. Contact a doctor if:  Your blood pressure keeps being high. Get help right away if:  Your first blood pressure number is higher than 180.  Your second blood pressure number is higher than 120. This information is not intended to replace advice given to you by your health care provider. Make sure you discuss any questions you have with your health care provider. Document Revised: 12/15/2016 Document Reviewed: 06/11/2015 Elsevier Patient Education  2020 [employer].

## 2020-01-08 ENCOUNTER — Other Ambulatory Visit: Payer: Self-pay | Admitting: Family Medicine

## 2020-02-17 ENCOUNTER — Other Ambulatory Visit: Payer: Self-pay | Admitting: Family Medicine

## 2020-02-17 NOTE — Telephone Encounter (Signed)
Need to be seen for future refills

## 2020-03-02 ENCOUNTER — Ambulatory Visit (INDEPENDENT_AMBULATORY_CARE_PROVIDER_SITE_OTHER): Payer: Medicare Other | Admitting: Family Medicine

## 2020-03-02 ENCOUNTER — Other Ambulatory Visit: Payer: Self-pay

## 2020-03-02 ENCOUNTER — Encounter: Payer: Self-pay | Admitting: Family Medicine

## 2020-03-02 VITALS — BP 132/76 | HR 50 | Temp 98.0°F | Ht 68.0 in | Wt 180.2 lb

## 2020-03-02 DIAGNOSIS — J439 Emphysema, unspecified: Secondary | ICD-10-CM

## 2020-03-02 DIAGNOSIS — N138 Other obstructive and reflux uropathy: Secondary | ICD-10-CM | POA: Diagnosis not present

## 2020-03-02 DIAGNOSIS — F419 Anxiety disorder, unspecified: Secondary | ICD-10-CM

## 2020-03-02 DIAGNOSIS — E7800 Pure hypercholesterolemia, unspecified: Secondary | ICD-10-CM

## 2020-03-02 DIAGNOSIS — F900 Attention-deficit hyperactivity disorder, predominantly inattentive type: Secondary | ICD-10-CM

## 2020-03-02 DIAGNOSIS — N401 Enlarged prostate with lower urinary tract symptoms: Secondary | ICD-10-CM

## 2020-03-02 DIAGNOSIS — E538 Deficiency of other specified B group vitamins: Secondary | ICD-10-CM

## 2020-03-02 DIAGNOSIS — I10 Essential (primary) hypertension: Secondary | ICD-10-CM | POA: Diagnosis not present

## 2020-03-02 LAB — CBC WITH DIFFERENTIAL/PLATELET
Basophils Absolute: 0 10*3/uL (ref 0.0–0.1)
Basophils Relative: 0.6 % (ref 0.0–3.0)
Eosinophils Absolute: 0.1 10*3/uL (ref 0.0–0.7)
Eosinophils Relative: 1.6 % (ref 0.0–5.0)
HCT: 44.6 % (ref 39.0–52.0)
Hemoglobin: 15.5 g/dL (ref 13.0–17.0)
Lymphocytes Relative: 26.8 % (ref 12.0–46.0)
Lymphs Abs: 1.9 10*3/uL (ref 0.7–4.0)
MCHC: 34.7 g/dL (ref 30.0–36.0)
MCV: 98.4 fl (ref 78.0–100.0)
Monocytes Absolute: 0.5 10*3/uL (ref 0.1–1.0)
Monocytes Relative: 6.6 % (ref 3.0–12.0)
Neutro Abs: 4.7 10*3/uL (ref 1.4–7.7)
Neutrophils Relative %: 64.4 % (ref 43.0–77.0)
Platelets: 115 10*3/uL — ABNORMAL LOW (ref 150.0–400.0)
RBC: 4.53 Mil/uL (ref 4.22–5.81)
RDW: 12.7 % (ref 11.5–15.5)
WBC: 7.2 10*3/uL (ref 4.0–10.5)

## 2020-03-02 LAB — LIPID PANEL
Cholesterol: 163 mg/dL (ref 0–200)
HDL: 32.3 mg/dL — ABNORMAL LOW (ref >39.00)
LDL Cholesterol: 99 mg/dL (ref 0–99)
NonHDL: 130.42
Total CHOL/HDL Ratio: 5
Triglycerides: 156 mg/dL — ABNORMAL HIGH (ref 0.0–149.0)
VLDL: 31.2 mg/dL (ref 0.0–40.0)

## 2020-03-02 LAB — BASIC METABOLIC PANEL
BUN: 15 mg/dL (ref 6–23)
CO2: 29 mEq/L (ref 19–32)
Calcium: 9.1 mg/dL (ref 8.4–10.5)
Chloride: 107 mEq/L (ref 96–112)
Creatinine, Ser: 0.98 mg/dL (ref 0.40–1.50)
GFR: 79.43 mL/min (ref >60.00)
Glucose, Bld: 84 mg/dL (ref 70–99)
Potassium: 4.8 mEq/L (ref 3.5–5.1)
Sodium: 143 mEq/L (ref 135–145)

## 2020-03-02 LAB — TSH: TSH: 2.64 u[IU]/mL (ref 0.35–4.50)

## 2020-03-02 LAB — HEPATIC FUNCTION PANEL
ALT: 10 U/L (ref 0–53)
AST: 12 U/L (ref 0–37)
Albumin: 4.1 g/dL (ref 3.5–5.2)
Alkaline Phosphatase: 76 U/L (ref 39–117)
Bilirubin, Direct: 0.1 mg/dL (ref 0.0–0.3)
Total Bilirubin: 0.7 mg/dL (ref 0.2–1.2)
Total Protein: 6.7 g/dL (ref 6.0–8.3)

## 2020-03-02 LAB — VITAMIN B12: Vitamin B-12: 119 pg/mL — ABNORMAL LOW (ref 211–911)

## 2020-03-02 LAB — PSA: PSA: 0.84 ng/mL (ref 0.10–4.00)

## 2020-03-02 MED ORDER — SERTRALINE HCL 50 MG PO TABS: 50.0000 mg | ORAL_TABLET | Freq: Every day | ORAL | 3 refills | Status: DC

## 2020-03-02 MED ORDER — LOSARTAN POTASSIUM 25 MG PO TABS: 50.0000 mg | ORAL_TABLET | Freq: Every day | ORAL | 3 refills | Status: DC

## 2020-03-02 MED ORDER — DOXAZOSIN MESYLATE 8 MG PO TABS: 8.0000 mg | ORAL_TABLET | Freq: Every day | ORAL | 3 refills | Status: DC

## 2020-03-02 MED ORDER — HYDROCHLOROTHIAZIDE 12.5 MG PO CAPS: 12.5000 mg | ORAL_CAPSULE | Freq: Every morning | ORAL | 3 refills | Status: DC

## 2020-03-02 NOTE — Progress Notes (Signed)
Subjective:    Patient ID: Bobby Cuevas, male    DOB: 1950/01/25, 70 y.o.   MRN: [id number]  HPI Here to follow up on issues. He feels well. His BP is stable. His anxiety is controlled.    Review of Systems  Constitutional: Negative.   HENT: Negative.   Eyes: Negative.   Respiratory: Negative.   Cardiovascular: Negative.   Gastrointestinal: Negative.   Genitourinary: Negative.   Musculoskeletal: Negative.   Skin: Negative.   Neurological: Negative.   Psychiatric/Behavioral: Negative.        Objective:   Physical Exam Constitutional:      General: He is not in acute distress.    Appearance: He is well-developed and well-nourished. He is not diaphoretic.  HENT:     Head: Normocephalic and atraumatic.     Right Ear: External ear normal.     Left Ear: External ear normal.     Nose: Nose normal.     Mouth/Throat:     Mouth: Oropharynx is clear and moist.     Pharynx: No oropharyngeal exudate.  Eyes:     General: No scleral icterus.       Right eye: No discharge.        Left eye: No discharge.     Extraocular Movements: EOM normal.     Conjunctiva/sclera: Conjunctivae normal.     Pupils: Pupils are equal, round, and reactive to light.  Neck:     Thyroid: No thyromegaly.     Vascular: No JVD.     Trachea: No tracheal deviation.  Cardiovascular:     Rate and Rhythm: Normal rate and regular rhythm.     Pulses: Intact distal pulses.     Heart sounds: Normal heart sounds. No murmur heard. No friction rub. No gallop.   Pulmonary:     Effort: Pulmonary effort is normal. No respiratory distress.     Breath sounds: Normal breath sounds. No wheezing or rales.  Chest:     Chest wall: No tenderness.  Abdominal:     General: Bowel sounds are normal. There is no distension.     Palpations: Abdomen is soft. There is no mass.     Tenderness: There is no abdominal tenderness. There is no guarding or rebound.  Genitourinary:    Penis: Normal. No tenderness.       Testes: Normal.     Prostate: Normal.     Rectum: Normal. Guaiac result negative.  Musculoskeletal:        General: No tenderness or edema. Normal range of motion.     Cervical back: Neck supple.  Lymphadenopathy:     Cervical: No cervical adenopathy.  Skin:    General: Skin is warm and dry.     Coloration: Skin is not pale.     Findings: No erythema or rash.  Neurological:     Mental Status: He is alert and oriented to person, place, and time.     Cranial Nerves: No cranial nerve deficit.     Motor: No abnormal muscle tone.     Coordination: Coordination normal.     Deep Tendon Reflexes: Reflexes are normal and symmetric. Reflexes normal.  Psychiatric:        Mood and Affect: Mood and affect normal.        Behavior: Behavior normal.        Thought Content: Thought content normal.        Judgment: Judgment normal.  Assessment & Plan:  His HTN and anxiety and BPH are stable. We will get fasting labs to check lipids, etc. His COPD is stable. [name], MD

## 2020-03-02 NOTE — Addendum Note (Signed)
Addended by: Marrion Coy on: 03/02/2020 10:08 AM   Modules accepted: Orders

## 2020-03-09 ENCOUNTER — Telehealth: Payer: Self-pay | Admitting: Family Medicine

## 2020-03-09 NOTE — Telephone Encounter (Signed)
Pt returned your call and want a call back.

## 2020-03-11 ENCOUNTER — Other Ambulatory Visit: Payer: Self-pay | Admitting: Family Medicine

## 2020-03-11 NOTE — Telephone Encounter (Signed)
Please see the following message from pharmacy medication on back order

## 2020-03-15 ENCOUNTER — Other Ambulatory Visit: Payer: Self-pay | Admitting: Family Medicine

## 2020-03-18 ENCOUNTER — Other Ambulatory Visit: Payer: Self-pay | Admitting: Family Medicine

## 2020-04-01 ENCOUNTER — Other Ambulatory Visit: Payer: Self-pay | Admitting: Family Medicine

## 2020-04-05 ENCOUNTER — Other Ambulatory Visit: Payer: Self-pay | Admitting: Family Medicine

## 2020-04-15 ENCOUNTER — Telehealth: Payer: Self-pay | Admitting: Family Medicine

## 2020-04-16 ENCOUNTER — Other Ambulatory Visit: Payer: Self-pay

## 2020-04-16 MED ORDER — LOSARTAN POTASSIUM 25 MG PO TABS: 25.0000 mg | ORAL_TABLET | Freq: Every day | ORAL | 3 refills | Status: DC

## 2020-04-16 NOTE — Telephone Encounter (Signed)
Rx sent to pt pharmacy and Benicar D/C per Dr Sarajane Jews

## 2020-04-16 NOTE — Telephone Encounter (Signed)
Please specify directions for Olmesartan.

## 2020-04-16 NOTE — Telephone Encounter (Signed)
He should not be on Olmesartan. Cancel this and instead call in Losartan 25 mg daily, #90 with 3 rf

## 2020-04-16 NOTE — Telephone Encounter (Signed)
  olmesartan (BENICAR) 5 MG tablet  This Rx was sent to the pharmacy with out directions  [id number] - [place], [address] Phone:  [phone]  Fax:  [phone]

## 2020-04-20 NOTE — Telephone Encounter (Signed)
Left pt a detailed message regarding Rx

## 2020-04-23 ENCOUNTER — Telehealth: Payer: Self-pay | Admitting: Acute Care

## 2020-04-23 DIAGNOSIS — Z87891 Personal history of nicotine dependence: Secondary | ICD-10-CM

## 2020-04-23 DIAGNOSIS — F17210 Nicotine dependence, cigarettes, uncomplicated: Secondary | ICD-10-CM

## 2020-04-26 NOTE — Telephone Encounter (Signed)
Will forward to [facility]) as pt is returning her call.

## 2020-04-26 NOTE — Telephone Encounter (Signed)
New  CT order placed

## 2020-04-26 NOTE — Telephone Encounter (Signed)
I have spoke with Bobby Cuevas and his LCS CT has been scheduled on 05/12/20 @ 10:40am at [place] location

## 2020-04-26 NOTE — Telephone Encounter (Signed)
Bobby Cuevas I will need a new LCS CT order before I can get his CT scheduled

## 2020-04-26 NOTE — Telephone Encounter (Signed)
I left a message asking Mr. Wolfinger to call me back [phone]

## 2020-05-12 ENCOUNTER — Ambulatory Visit
Admission: RE | Admit: 2020-05-12 | Discharge: 2020-05-12 | Disposition: A | Discharge status: Home / Self Care | Payer: Medicare Other | Source: Ambulatory Visit | Attending: Acute Care | Admitting: Acute Care

## 2020-05-12 ENCOUNTER — Other Ambulatory Visit: Payer: Self-pay

## 2020-05-12 DIAGNOSIS — Z87891 Personal history of nicotine dependence: Secondary | ICD-10-CM

## 2020-05-12 DIAGNOSIS — F17210 Nicotine dependence, cigarettes, uncomplicated: Secondary | ICD-10-CM | POA: Diagnosis not present

## 2020-05-22 NOTE — Progress Notes (Signed)
Please call patient and let them  know their  low dose Ct was read as a Lung RADS 2: nodules that are benign in appearance and behavior with a very low likelihood of becoming a clinically active cancer due to size or lack of growth. Recommendation per radiology is for a repeat LDCT in 12 months. .Please let them  know we will order and schedule their  annual screening scan for 04/2021. Please let them  know there was notation of CAD on their  scan.  Please remind the patient  that this is a non-gated exam therefore degree or severity of disease  cannot be determined. Please have them  follow up with their PCP regarding potential risk factor modification, dietary therapy or pharmacologic therapy if clinically indicated. Pt.  is not  currently on statin therapy. Please place order for annual  screening scan for  04/2021 and fax results to PCP. Thanks so much.  Please have patient follow up with PCP regarding CAD. They are not on statin therapy.

## 2020-05-25 ENCOUNTER — Other Ambulatory Visit: Payer: Self-pay | Admitting: *Deleted

## 2020-05-25 DIAGNOSIS — F17210 Nicotine dependence, cigarettes, uncomplicated: Secondary | ICD-10-CM

## 2020-05-25 DIAGNOSIS — Z87891 Personal history of nicotine dependence: Secondary | ICD-10-CM

## 2020-06-01 ENCOUNTER — Telehealth: Payer: Self-pay | Admitting: Pharmacist

## 2020-06-01 NOTE — Chronic Care Management (AMB) (Signed)
I left the patient a message about his upcoming appointment on 06/02/2020 @ 1:00 pm with the clinical pharmacist. He was asked to please have all medication on hand to review with the pharmacist.  Neita Goodnight) Mare Ferrari, [place] [phone]

## 2020-06-02 ENCOUNTER — Ambulatory Visit (INDEPENDENT_AMBULATORY_CARE_PROVIDER_SITE_OTHER): Payer: Medicare Other | Admitting: Pharmacist

## 2020-06-02 DIAGNOSIS — N138 Other obstructive and reflux uropathy: Secondary | ICD-10-CM | POA: Diagnosis not present

## 2020-06-02 DIAGNOSIS — I10 Essential (primary) hypertension: Secondary | ICD-10-CM

## 2020-06-02 DIAGNOSIS — N401 Enlarged prostate with lower urinary tract symptoms: Secondary | ICD-10-CM | POA: Diagnosis not present

## 2020-06-02 NOTE — Progress Notes (Signed)
Chronic Care Management Pharmacy Note  06/15/2020 Name:  Bobby Cuevas MRN:  [id number] DOB:  Jul 25, 1950  Subjective: Bobby Cuevas is an 70 y.o. year old male who is a primary patient of Bobby Morale, MD.  The CCM team was consulted for assistance with disease management and care coordination needs.    Engaged with patient by telephone for follow up visit in response to provider referral for pharmacy case management and/or care coordination services.   Consent to Services:  The patient was given information about Chronic Care Management services, agreed to services, and gave verbal consent prior to initiation of services.  Please see initial visit note for detailed documentation.   Patient Care Team: Bobby Morale, MD as PCP - General (Family Medicine) Bobby Cuevas, [facility] as Pharmacist (Pharmacist)  Recent office visits: 03/02/20 Bobby Penna, MD: Patient presented for annual exam. No medication changes made.   Recent consult visits: 05/02/19 Bobby Cuevas (oral and maxillofacial pathology): Patient presented for unspecified lesions of oral mucosa. Unable to access notes.  Hospital visits: None in previous 6 months  Objective:  Lab Results  Component Value Date   CREATININE 0.98 03/02/2020   BUN 15 03/02/2020   GFR 79.43 03/02/2020   GFRNONAA 92.44 01/05/2009   GFRAA 113 10/30/2007   NA 143 03/02/2020   K 4.8 03/02/2020   CALCIUM 9.1 03/02/2020   CO2 29 03/02/2020   GLUCOSE 84 03/02/2020    Lab Results  Component Value Date/Time   GFR 79.43 03/02/2020 10:09 AM   GFR 79.04 02/27/2019 10:07 AM    Last diabetic Eye exam: No results found for: HMDIABEYEEXA  Last diabetic Foot exam: No results found for: HMDIABFOOTEX   Lab Results  Component Value Date   CHOL 163 03/02/2020   HDL 32.30 (L) 03/02/2020   LDLCALC 99 03/02/2020   LDLDIRECT 116.0 11/24/2013   TRIG 156.0 (H) 03/02/2020   CHOLHDL 5 03/02/2020    Hepatic Function Latest Ref Rng & Units  03/02/2020 02/27/2019 02/07/2017  Total Protein 6.0 - 8.3 g/dL 6.7 6.5 7.0  Albumin 3.5 - 5.2 g/dL 4.1 4.2 4.4  AST 0 - 37 U/L '12 13 13  ' ALT 0 - 53 U/L '10 14 13  ' Alk Phosphatase 39 - 117 U/L 76 66 68  Total Bilirubin 0.2 - 1.2 mg/dL 0.7 0.6 0.7  Bilirubin, Direct 0.0 - 0.3 mg/dL 0.1 0.1 0.1    Lab Results  Component Value Date/Time   TSH 2.64 03/02/2020 10:09 AM   TSH 2.02 02/27/2019 10:07 AM    CBC Latest Ref Rng & Units 03/02/2020 02/27/2019 02/07/2017  WBC 4.0 - 10.5 K/uL 7.2 7.9 7.1  Hemoglobin 13.0 - 17.0 g/dL 15.5 15.4 15.8  Hematocrit 39.0 - 52.0 % 44.6 45.1 46.0  Platelets 150.0 - 400.0 K/uL 115.0(L) 133.0(L) 146.0(L)    No results found for: VD25OH  Clinical ASCVD: No  The 10-year ASCVD risk score Bobby Cuevas) is: 26.5%   Values used to calculate the score:     Age: 13 years     Sex: Male     Is Non-Hispanic African American: No     Diabetic: No     Tobacco smoker: Yes     Systolic Blood Pressure: 182 mmHg     Is BP treated: Yes     HDL Cholesterol: 32.3 mg/dL     Total Cholesterol: 163 mg/dL    Depression screen [facility] 2/9 10/16/2019 02/08/2018 02/07/2017  Decreased Interest  0 0 0  Down, Depressed, Hopeless 0 0 0  PHQ - 2 Score 0 0 0  Altered sleeping 0 - -  Tired, decreased energy 0 - -  Change in appetite 0 - -  Feeling bad or failure about yourself  0 - -  Trouble concentrating 0 - -  Moving slowly or fidgety/restless 0 - -  Suicidal thoughts 0 - -  PHQ-9 Score 0 - -  Difficult doing work/chores Not difficult at all - -      Social History   Tobacco Use  Smoking Status Current Every Day Smoker  . Packs/day: 1.00  . Years: 48.00  . Pack years: 48.00  . Types: Cigarettes  Smokeless Tobacco Never Used  Tobacco Comment   Counseled on smoking cessation   BP Readings from Last 3 Encounters:  03/02/20 132/76  11/12/19 118/60  02/27/19 110/60   Pulse Readings from Last 3 Encounters:  03/02/20 (!) 50  11/12/19 69  02/27/19 64   Wt  Readings from Last 3 Encounters:  03/02/20 180 lb 3.2 oz (81.7 kg)  11/12/19 181 lb 3.2 oz (82.2 kg)  02/27/19 180 lb 6.4 oz (81.8 kg)   BMI Readings from Last 3 Encounters:  03/02/20 27.40 kg/m  11/12/19 27.55 kg/m  02/27/19 27.43 kg/m    Assessment/Interventions: Review of patient past medical history, allergies, medications, health status, including review of consultants reports, laboratory and other test data, was performed as part of comprehensive evaluation and provision of chronic care management services.   SDOH:  (Social Determinants of Health) assessments and interventions performed: No  SDOH Screenings   Alcohol Screen: Low Risk   . Last Alcohol Screening Score (AUDIT): 2  Depression (PHQ2-9): Low Risk   . PHQ-2 Score: 0  Financial Resource Strain: Low Risk   . Difficulty of Paying Living Expenses: Not hard at all  Food Insecurity: No Food Insecurity  . Worried About Charity fundraiser in the Last Year: Never true  . Ran Out of Food in the Last Year: Never true  Housing: Low Risk   . Last Housing Risk Score: 0  Physical Activity: Inactive  . Days of Exercise per Week: 0 days  . Minutes of Exercise per Session: 0 min  Social Connections: Moderately Integrated  . Frequency of Communication with Friends and Family: More than three times a week  . Frequency of Social Gatherings with Friends and Family: More than three times a week  . Attends Religious Services: More than 4 times per year  . Active Member of Clubs or Organizations: Yes  . Attends Archivist Meetings: More than 4 times per year  . Marital Status: Separated  Stress: No Stress Concern Present  . Feeling of Stress : Not at all  Tobacco Use: High Risk  . Smoking Tobacco Use: Current Every Day Smoker  . Smokeless Tobacco Use: Never Used  Transportation Needs: No Transportation Needs  . Lack of Transportation (Medical): No  . Lack of Transportation (Non-Medical): No    CCM Care  Plan  Allergies  Allergen Reactions  . Wellbutrin [Bupropion] Hives    Rash on feet    Medications Reviewed Today    Reviewed by Bobby Morale, MD (Physician) on 03/02/20 at [phone]  Med List Status: <None>  Medication Order Taking? Sig Documenting Provider Last Dose Status Informant  0.9 %  sodium chloride infusion [id number]   Milus Banister, MD  Active   aspirin 81 MG tablet [id number] Yes Take  81 mg by mouth daily. [provider] Taking Active   doxazosin (CARDURA) 8 MG tablet [id number] Yes TAKE 1 TABLET (8 MG TOTAL) BY MOUTH EVERY MORNING. Bobby Morale, MD Taking Active   hydrochlorothiazide (MICROZIDE) 12.5 MG capsule [id number] Yes TAKE 1 CAPSULE BY MOUTH EVERY DAY Bobby Morale, MD Taking Active   losartan (COZAAR) 25 MG tablet [id number] Yes TAKE 2 TABLETS BY MOUTH EVERY DAY Bobby Morale, MD Taking Active   sertraline (ZOLOFT) 50 MG tablet [id number] Yes TAKE 1 TABLET BY MOUTH EVERY DAY Bobby Morale, MD Taking Active           Patient Active Problem List   Diagnosis Date Noted  . Herpes zoster without complication 36/62/9476  . Anxiety disorder 02/07/2017  . COPD with emphysema ([place]) 07/05/2016  . Attention deficit disorder 03/03/2009  . ERECTILE DYSFUNCTION, ORGANIC 01/18/2009  . TOBACCO ABUSE 11/05/2007  . Essential hypertension 10/01/2006    Immunization History  Administered Date(s) Administered  . Influenza Split 10/24/2010  . Influenza Whole 11/05/2007, 10/16/2009  . Influenza, High Dose Seasonal PF 02/07/2017, 09/23/2017, 12/14/2018  . Influenza,inj,Quad PF,6+ Mos 11/25/2012, 11/24/2013, 12/28/2014, 12/23/2015  . Influenza-Unspecified 09/23/2017  . PFIZER(Purple Top)SARS-COV-2 Vaccination 02/08/2019, 03/17/2019  . Pneumococcal Conjugate-13 02/08/2018  . Pneumococcal Polysaccharide-23 02/27/2019  . Td 01/16/2001  . Tdap 12/24/2013    Conditions to be addressed/monitored:  Hypertension, Hyperlipidemia, Anxiety, Tobacco use and Prevention of  heart events  Care Plan : CCM Pharmacy Care Plan  Updates made by Bobby Cuevas, [place] since 06/15/2020 12:00 AM    Problem: Problem: Hypertension, Hyperlipidemia, Anxiety, Tobacco use and Prevention of heart events     Long-Range Goal: Patient-Specific Goal   Start Date: 06/02/2020  Expected End Date: 06/02/2021  This Visit's Progress: On track  Priority: High  Note:   Current Barriers:  . Unable to independently monitor therapeutic efficacy  Pharmacist Clinical Goal(s):  Marland Kitchen Patient will achieve adherence to monitoring guidelines and medication adherence to achieve therapeutic efficacy through collaboration with PharmD and provider.   Interventions: . 1:1 collaboration with Bobby Morale, MD regarding development and update of comprehensive plan of care as evidenced by provider attestation and co-signature . Inter-disciplinary care team collaboration (see longitudinal plan of care) . Comprehensive medication review performed; medication list updated in electronic medical record  Hypertension (BP goal <140/90) -Controlled -Current treatment:  Hydrochlorothiazide 12.5 mg 1 capsule daily - in PM - stopped taking this one  Losartan 25 mg 1 tablet daily - in PM  Doxazosin 8 mg 1 tablet daily - in PM -Medications previously tried: none  -Current home readings: does not check -Current dietary habits: did not discuss -Current exercise habits: did not discuss -Reports hypotensive/hypertensive symptoms -Educated on Exercise goal of 150 minutes per week; Importance of home blood pressure monitoring; Proper BP monitoring technique; -Counseled to monitor BP at home weekly, document, and provide log at future appointments -Collaborated with PCP about simplifying patient's regimen  ASCVD prevention: (LDL goal < 100) -Not ideally controlled -Current treatment: . Aspirin 81 mg 1 tablet daily -Medications previously tried: none  -Current dietary patterns: did not discuss -Current  exercise habits: did not discuss -Educated on Cholesterol goals;  Benefits of statin for ASCVD risk reduction; Importance of limiting foods high in cholesterol; -Counseled on diet and exercise extensively   Depression/Anxiety (Goal: minimize symptoms) -Controlled -Current treatment: . Sertraline 50 mg 1 tablet daily -Medications previously tried/failed: none -PHQ9: 0 -GAD7: n/a -Educated on Benefits of medication for  symptom control -Recommended to continue current medication  Tobacco use (Goal quit smoking) -Uncontrolled -Previous quit attempts: none -Current treatment  . No medications -Patient smokes Within 30 minutes of waking -Patient triggers include: going to a social event -On a scale of 1-10, reports MOTIVATION to quit is 1 -On a scale of 1-10, reports CONFIDENCE in quitting is 1 -Provided contact information for [place] Quit Line (1-800-QUIT-NOW) and encouraged patient to reach out to this group for support. -Counseled on benefits of smoking cessation  Health Maintenance -Vaccine gaps: shingrix -Current therapy:  . No medications -Educated on Cost vs benefit of each product must be carefully weighed by individual consumer -Patient is satisfied with current therapy and denies issues -Recommended to continue as is  Patient Goals/Self-Care Activities . Patient will:  - take medications as prescribed check blood pressure weekly, document, and provide at future appointments target a minimum of 150 minutes of moderate intensity exercise weekly  Follow Up Plan: The care management team will reach out to the patient again over the next 30 days.        Medication Assistance: None required.  Patient affirms current coverage meets needs.  Patient's preferred pharmacy is:  [id number] [place], [place] - [id number]: [phone]: [phone] Uses pill box? No - routine with nightly  use Pt endorses 100% compliance  We discussed: Current pharmacy is preferred with insurance plan and patient is satisfied with pharmacy services Patient decided to: Continue current medication management strategy  Care Plan and Follow Up Patient Decision:  Patient agrees to Care Plan and Follow-up.  Plan: The care management team will reach out to the patient again over the next 30  days.  MJeni Salles PharmD BPresence Saint Joseph HospitalClinical Pharmacist LChesapeake Ranch Estatesat B[place]ula

## 2020-06-15 NOTE — Patient Instructions (Signed)
Visit Information  Goals Addressed   None    Patient Care Plan: CCM Pharmacy Care Plan    Problem Identified: Problem: Hypertension, Hyperlipidemia, Anxiety, Tobacco use and Prevention of heart events     Long-Range Goal: Patient-Specific Goal   Start Date: 06/02/2020  Expected End Date: 06/02/2021  This Visit's Progress: On track  Priority: High  Note:   Current Barriers:  . Unable to independently monitor therapeutic efficacy  Pharmacist Clinical Goal(s):  Marland Kitchen Patient will achieve adherence to monitoring guidelines and medication adherence to achieve therapeutic efficacy through collaboration with PharmD and provider.   Interventions: . 1:1 collaboration with Laurey Morale, MD regarding development and update of comprehensive plan of care as evidenced by provider attestation and co-signature . Inter-disciplinary care team collaboration (see longitudinal plan of care) . Comprehensive medication review performed; medication list updated in electronic medical record  Hypertension (BP goal <140/90) -Controlled -Current treatment:  Hydrochlorothiazide 12.5 mg 1 capsule daily - in PM - stopped taking this one  Losartan 25 mg 1 tablet daily - in PM  Doxazosin 8 mg 1 tablet daily - in PM -Medications previously tried: none  -Current home readings: does not check -Current dietary habits: did not discuss -Current exercise habits: did not discuss -Reports hypotensive/hypertensive symptoms -Educated on Exercise goal of 150 minutes per week; Importance of home blood pressure monitoring; Proper BP monitoring technique; -Counseled to monitor BP at home weekly, document, and provide log at future appointments -Collaborated with PCP about simplifying patient's regimen  ASCVD prevention: (LDL goal < 100) -Not ideally controlled -Current treatment: . Aspirin 81 mg 1 tablet daily -Medications previously tried: none  -Current dietary patterns: did not discuss -Current exercise habits:  did not discuss -Educated on Cholesterol goals;  Benefits of statin for ASCVD risk reduction; Importance of limiting foods high in cholesterol; -Counseled on diet and exercise extensively   Depression/Anxiety (Goal: minimize symptoms) -Controlled -Current treatment: . Sertraline 50 mg 1 tablet daily -Medications previously tried/failed: none -PHQ9: 0 -GAD7: n/a -Educated on Benefits of medication for symptom control -Recommended to continue current medication  Tobacco use (Goal quit smoking) -Uncontrolled -Previous quit attempts: none -Current treatment  . No medications -Patient smokes Within 30 minutes of waking -Patient triggers include: going to a social event -On a scale of 1-10, reports MOTIVATION to quit is 1 -On a scale of 1-10, reports CONFIDENCE in quitting is 1 -Provided contact information for [place] Quit Line (1-800-QUIT-NOW) and encouraged patient to reach out to this group for support. -Counseled on benefits of smoking cessation  Health Maintenance -Vaccine gaps: shingrix -Current therapy:  . No medications -Educated on Cost vs benefit of each product must be carefully weighed by individual consumer -Patient is satisfied with current therapy and denies issues -Recommended to continue as is  Patient Goals/Self-Care Activities . Patient will:  - take medications as prescribed check blood pressure weekly, document, and provide at future appointments target a minimum of 150 minutes of moderate intensity exercise weekly  Follow Up Plan: The care management team will reach out to the patient again over the next 30 days.        Patient verbalizes understanding of instructions provided today and agrees to view in [employer].  The pharmacy team will reach out to the patient again over the next 30 days.   Viona Gilmore, [facility]

## 2020-06-29 ENCOUNTER — Telehealth: Payer: Self-pay | Admitting: Pharmacist

## 2020-06-29 NOTE — Telephone Encounter (Signed)
Called patient to follow up on discussion with PCP about BP medications. Patient would like to restart taking his HCTZ and will start checking his BP at home every day. He declined making an office visit at this time to have his BP rechecked. Patient reports he has not had dizziness lately.  Plan for telephone BP assessment in 2-3 weeks to see how he is doing with restarting HCTZ.

## 2020-07-22 ENCOUNTER — Telehealth: Payer: Self-pay | Admitting: Pharmacist

## 2020-07-22 NOTE — Chronic Care Management (AMB) (Signed)
Chronic Care Management Pharmacy Assistant   Name: Bobby Cuevas  MRN: [id number] DOB: [birth year]  Reason for Encounter: Disease State   Conditions to be addressed/monitored: HTN  Recent office visits:  None  Recent consult visits:  None  Hospital visits:  None in previous 6 months  Medications: Outpatient Encounter Medications as of 07/22/2020  Medication Sig   aspirin 81 MG tablet Take 81 mg by mouth daily.   doxazosin (CARDURA) 8 MG tablet Take 1 tablet (8 mg total) by mouth at bedtime.   hydrochlorothiazide (MICROZIDE) 12.5 MG capsule Take 1 capsule (12.5 mg total) by mouth in the morning.   losartan (COZAAR) 25 MG tablet Take 1 tablet (25 mg total) by mouth daily.   sertraline (ZOLOFT) 50 MG tablet Take 1 tablet (50 mg total) by mouth daily.   Facility-Administered Encounter Medications as of 07/22/2020  Medication   0.9 %  sodium chloride infusion  Reviewed chart prior to disease state call. Spoke with patient regarding BP  Recent Office Vitals: BP Readings from Last 3 Encounters:  03/02/20 132/76  11/12/19 118/60  02/27/19 110/60   Pulse Readings from Last 3 Encounters:  03/02/20 (!) 50  11/12/19 69  02/27/19 64    Wt Readings from Last 3 Encounters:  03/02/20 180 lb 3.2 oz (81.7 kg)  11/12/19 181 lb 3.2 oz (82.2 kg)  02/27/19 180 lb 6.4 oz (81.8 kg)     Kidney Function Lab Results  Component Value Date/Time   CREATININE 0.98 03/02/2020 10:09 AM   CREATININE 0.95 02/27/2019 10:07 AM   CREATININE 0.84 04/16/2015 11:32 AM   GFR 79.43 03/02/2020 10:09 AM   GFRNONAA 92.44 01/05/2009 08:59 AM   GFRAA 113 10/30/2007 09:11 AM    BMP Latest Ref Rng & Units 03/02/2020 02/27/2019 02/07/2017  Glucose 70 - 99 mg/dL 84 81 85  BUN 6 - 23 mg/dL 15 17 25(H)  Creatinine 0.40 - 1.50 mg/dL 0.98 0.95 0.95  Sodium 135 - 145 mEq/L 143 142 143  Potassium 3.5 - 5.1 mEq/L 4.8 4.0 3.8  Chloride 96 - 112 mEq/L 107 107 105  CO2 19 - 32 mEq/L 29 28 27   Calcium 8.4 -  10.5 mg/dL 9.1 8.9 9.1   Current antihypertensive regimen:  Hydrochlorothiazide 12.5 mg 1 capsule daily - in AM Losartan 25 mg 1 tablet daily - in PM Doxazosin 8 mg 1 tablet daily - in PM How often are you checking your Blood Pressure? 3-5x per week Current home BP readings:  06.21 126/63 06.22 120/60 06.25 132/60 06.28 134/66 07.01 147/69 What recent interventions/DTPs have been made by any provider to improve Blood Pressure control since last CPP Visit: Restarted HCTZ 12.5 mg on 06.14.2022 Any recent hospitalizations or ED visits since last visit with CPP? No What diet changes have been made to improve Blood Pressure Control?  No change What exercise is being done to improve your Blood Pressure Control?  No Change  Adherence Review: Is the patient currently on ACE/ARB medication? Yes Does the patient have >5 day gap between last estimated fill dates? No  I called and spoke with the patient about medication adherence. He stated that he did restart the HCTZ. He has not had any side effects since restarting this medication. He says that he has been doing well. He tries to take his blood pressure at least three to five times a week. There have been no recent changes to his medications. There have been no changes to his diet or  his exercise routine. There have been no emergency department, hospital, or urgent care visits since his last CPP or PCP visit. He does not have any issues with his current pharmacy[facility]   Star Rating Drugs: Medication Dispensed Quantity Pharmacy  Losartan 25 mg [facility]30.2022 [facility]    Amilia (Yznaga) Mare Ferrari, [place] Pharmacist Assistant [phone]

## 2020-10-18 ENCOUNTER — Ambulatory Visit (INDEPENDENT_AMBULATORY_CARE_PROVIDER_SITE_OTHER): Payer: Medicare Other

## 2020-10-18 DIAGNOSIS — Z0000 Encounter for general adult medical examination without abnormal findings: Secondary | ICD-10-CM

## 2020-10-18 NOTE — Patient Instructions (Signed)
[name] , Thank you for taking time to come for your Medicare Wellness Visit. I appreciate your ongoing commitment to your health goals. Please review the following plan we discussed and let me know if I can assist you in the future.   Screening recommendations/referrals: Colonoscopy: 04/18/2017 Recommended yearly ophthalmology/optometry visit for glaucoma screening and checkup Recommended yearly dental visit for hygiene and checkup  Vaccinations: Influenza vaccine: due in fall 2022  Pneumococcal vaccine: completed series  Tdap vaccine: 12/24/2013 Shingles vaccine:  will consider   Advanced directives: will provide copies   Conditions/risks identified: none   Next appointment: none   Preventive Care 67 Years and Older, Male Preventive care refers to lifestyle choices and visits with your health care provider that can promote health and wellness. What does preventive care include? A yearly physical exam. This is also called an annual well check. Dental exams once or twice a year. Routine eye exams. Ask your health care provider how often you should have your eyes checked. Personal lifestyle choices, including: Daily care of your teeth and gums. Regular physical activity. Eating a healthy diet. Avoiding tobacco and drug use. Limiting alcohol use. Practicing safe sex. Taking low doses of aspirin every day. Taking vitamin and mineral supplements as recommended by your health care provider. What happens during an annual well check? The services and screenings done by your health care provider during your annual well check will depend on your age, overall health, lifestyle risk factors, and family history of disease. Counseling  Your health care provider may ask you questions about your: Alcohol use. Tobacco use. Drug use. Emotional well-being. Home and relationship well-being. Sexual activity. Eating habits. History of falls. Memory and ability to understand  (cognition). Work and work Statistician. Screening  You may have the following tests or measurements: Height, weight, and BMI. Blood pressure. Lipid and cholesterol levels. These may be checked every 5 years, or more frequently if you are over 57 years old. Skin check. Lung cancer screening. You may have this screening every year starting at age 40 if you have a 30-pack-year history of smoking and currently smoke or have quit within the past 15 years. Fecal occult blood test (FOBT) of the stool. You may have this test every year starting at age 4. Flexible sigmoidoscopy or colonoscopy. You may have a sigmoidoscopy every 5 years or a colonoscopy every 10 years starting at age 66. Prostate cancer screening. Recommendations will vary depending on your family history and other risks. Hepatitis C blood test. Hepatitis B blood test. Sexually transmitted disease (STD) testing. Diabetes screening. This is done by checking your blood sugar (glucose) after you have not eaten for a while (fasting). You may have this done every 1-3 years. Abdominal aortic aneurysm (AAA) screening. You may need this if you are a current or former smoker. Osteoporosis. You may be screened starting at age 5 if you are at high risk. Talk with your health care provider about your test results, treatment options, and if necessary, the need for more tests. Vaccines  Your health care provider may recommend certain vaccines, such as: Influenza vaccine. This is recommended every year. Tetanus, diphtheria, and acellular pertussis (Tdap, Td) vaccine. You may need a Td booster every 10 years. Zoster vaccine. You may need this after age 87. Pneumococcal 13-valent conjugate (PCV13) vaccine. One dose is recommended after age 52. Pneumococcal polysaccharide (PPSV23) vaccine. One dose is recommended after age 56. Talk to your health care provider about which screenings and vaccines you  need and how often you need them. This  information is not intended to replace advice given to you by your health care provider. Make sure you discuss any questions you have with your health care provider. Document Released: 01/29/2015 Document Revised: 09/22/2015 Document Reviewed: 11/03/2014 Elsevier Interactive Patient Education  2017 [place] Prevention in the Home Falls can cause injuries. They can happen to people of all ages. There are many things you can do to make your home safe and to help prevent falls. What can I do on the outside of my home? Regularly fix the edges of walkways and driveways and fix any cracks. Remove anything that might make you trip as you walk through a door, such as a raised step or threshold. Trim any bushes or trees on the path to your home. Use bright outdoor lighting. Clear any walking paths of anything that might make someone trip, such as rocks or tools. Regularly check to see if handrails are loose or broken. Make sure that both sides of any steps have handrails. Any raised decks and porches should have guardrails on the edges. Have any leaves, snow, or ice cleared regularly. Use sand or salt on walking paths during winter. Clean up any spills in your garage right away. This includes oil or grease spills. What can I do in the bathroom? Use night lights. Install grab bars by the toilet and in the tub and shower. Do not use towel bars as grab bars. Use non-skid mats or decals in the tub or shower. If you need to sit down in the shower, use a plastic, non-slip stool. Keep the floor dry. Clean up any water that spills on the floor as soon as it happens. Remove soap buildup in the tub or shower regularly. Attach bath mats securely with double-sided non-slip rug tape. Do not have throw rugs and other things on the floor that can make you trip. What can I do in the bedroom? Use night lights. Make sure that you have a light by your bed that is easy to reach. Do not use any sheets or  blankets that are too big for your bed. They should not hang down onto the floor. Have a firm chair that has side arms. You can use this for support while you get dressed. Do not have throw rugs and other things on the floor that can make you trip. What can I do in the kitchen? Clean up any spills right away. Avoid walking on wet floors. Keep items that you use a lot in easy-to-reach places. If you need to reach something above you, use a strong step stool that has a grab bar. Keep electrical cords out of the way. Do not use floor polish or wax that makes floors slippery. If you must use wax, use non-skid floor wax. Do not have throw rugs and other things on the floor that can make you trip. What can I do with my stairs? Do not leave any items on the stairs. Make sure that there are handrails on both sides of the stairs and use them. Fix handrails that are broken or loose. Make sure that handrails are as long as the stairways. Check any carpeting to make sure that it is firmly attached to the stairs. Fix any carpet that is loose or worn. Avoid having throw rugs at the top or bottom of the stairs. If you do have throw rugs, attach them to the floor with carpet tape. Make sure that  you have a light switch at the top of the stairs and the bottom of the stairs. If you do not have them, ask someone to add them for you. What else can I do to help prevent falls? Wear shoes that: Do not have high heels. Have rubber bottoms. Are comfortable and fit you well. Are closed at the toe. Do not wear sandals. If you use a stepladder: Make sure that it is fully opened. Do not climb a closed stepladder. Make sure that both sides of the stepladder are locked into place. Ask someone to hold it for you, if possible. Clearly mark and make sure that you can see: Any grab bars or handrails. First and last steps. Where the edge of each step is. Use tools that help you move around (mobility aids) if they are  needed. These include: Canes. Walkers. Scooters. Crutches. Turn on the lights when you go into a dark area. Replace any light bulbs as soon as they burn out. Set up your furniture so you have a clear path. Avoid moving your furniture around. If any of your floors are uneven, fix them. If there are any pets around you, be aware of where they are. Review your medicines with your doctor. Some medicines can make you feel dizzy. This can increase your chance of falling. Ask your doctor what other things that you can do to help prevent falls. This information is not intended to replace advice given to you by your health care provider. Make sure you discuss any questions you have with your health care provider. Document Released: 10/29/2008 Document Revised: 06/10/2015 Document Reviewed: 02/06/2014 Elsevier Interactive Patient Education  2017 [employer].

## 2020-10-18 NOTE — Progress Notes (Signed)
Subjective:   Bobby Cuevas is a 70 y.o. male who presents for an Initial Medicare Annual Wellness Visit.   Virtual Visit via Video Note  I connected with Griselda Miner by a video enabled telemedicine application and verified that I am speaking with the correct person using two identifiers.  Location: Patient: Home Provider: Office Persons participating in the virtual visit: patient, provider   I discussed the limitations of evaluation and management by telemedicine and the availability of in person appointments. The patient expressed understanding and agreed to proceed.     [place]  Review of Systems    N/a       Objective:    There were no vitals filed for this visit. There is no height or weight on file to calculate BMI.  Advanced Directives 10/16/2019  Does Patient Have a Medical Advance Directive? Yes  Type of Paramedic of [place];Living will  Does patient want to make changes to medical advance directive? No - Patient declined  Copy of [place] in Chart? No - copy requested    Current Medications (verified) Outpatient Encounter Medications as of 10/18/2020  Medication Sig   aspirin 81 MG tablet Take 81 mg by mouth daily.   doxazosin (CARDURA) 8 MG tablet Take 1 tablet (8 mg total) by mouth at bedtime.   hydrochlorothiazide (MICROZIDE) 12.5 MG capsule Take 1 capsule (12.5 mg total) by mouth in the morning.   losartan (COZAAR) 25 MG tablet Take 1 tablet (25 mg total) by mouth daily.   sertraline (ZOLOFT) 50 MG tablet Take 1 tablet (50 mg total) by mouth daily.   Facility-Administered Encounter Medications as of 10/18/2020  Medication   0.9 %  sodium chloride infusion    Allergies (verified) Wellbutrin [bupropion]   History: Past Medical History:  Diagnosis Date   Anxiety    COPD (chronic obstructive pulmonary disease) (HCC)    emphysema on CT    Hyperlipidemia    Hypertension    Tobacco  abuse    Past Surgical History:  Procedure Laterality Date   APPENDECTOMY     COLONOSCOPY  04/18/2017   per Dr. Ardis Hughs, adenomatous polyps, repeat in 5 yrs    REDUCTION OF TORSION OF TESTIS  at age 50   SPINE SURGERY     Family History  Problem Relation Age of Onset   Lung disease Father    Lung cancer Father    Alcohol abuse Other    Hypertension Other    Cancer Other    Lung disease Other    Colon cancer Neg Hx    Esophageal cancer Neg Hx    Stomach cancer Neg Hx    Rectal cancer Neg Hx    Liver cancer Neg Hx    Pancreatic cancer Neg Hx    Prostate cancer Neg Hx    Social History   Socioeconomic History   Marital status: Legally Separated    Spouse name: Not on file   Number of children: Not on file   Years of education: Not on file   Highest education level: Not on file  Occupational History   Not on file  Tobacco Use   Smoking status: Every Day    Packs/day: 1.00    Years: 48.00    Pack years: 48.00    Types: Cigarettes   Smokeless tobacco: Never   Tobacco comments:    Counseled on smoking cessation  Vaping Use   Vaping Use: Never used  Substance and Sexual Activity   Alcohol use: Yes    Alcohol/week: 0.0 standard drinks    Comment: 1-2 beers per month per pt   Drug use: No   Sexual activity: Yes  Other Topics Concern   Not on file  Social History Narrative   Not on file   Social Determinants of Health   Financial Resource Strain: Low Risk    Difficulty of Paying Living Expenses: Not hard at all  Food Insecurity: Not on file  Transportation Needs: No Transportation Needs   Lack of Transportation (Medical): No   Lack of Transportation (Non-Medical): No  Physical Activity: Not on file  Stress: Not on file  Social Connections: Not on file    Tobacco Counseling Ready to quit: Not Answered Counseling given: Not Answered Tobacco comments: Counseled on smoking cessation   Clinical Intake:                 Diabetic?no          Activities of Daily Living No flowsheet data found.  Patient Care Team: Laurey Morale, MD as PCP - General (Family Medicine) Viona Gilmore, [facility] as Pharmacist (Pharmacist)  Indicate any recent Medical Services you may have received from other than [facility] providers in the past year (date may be approximate).     Assessment:   This is a routine wellness examination for Bobby Cuevas.  Hearing/Vision screen No results found.  Dietary issues and exercise activities discussed:     Goals Addressed   None    Depression Screen PHQ 2/9 Scores 10/16/2019 02/08/2018 02/07/2017  PHQ - 2 Score 0 0 0  PHQ- 9 Score 0 - -    Fall Risk Fall Risk  10/16/2019 02/08/2018 02/07/2017  Falls in the past year? 0 0 No  Number falls in past yr: 0 - -  Injury with Fall? 0 - -  Risk for fall due to : No Fall Risks - -  Follow up Falls evaluation completed;Falls prevention discussed - -    FALL RISK PREVENTION PERTAINING TO THE HOME:  Any stairs in or around the home? Yes  If so, are there any without handrails? No  Home free of loose throw rugs in walkways, pet beds, electrical cords, etc? Yes  Adequate lighting in your home to reduce risk of falls? Yes   ASSISTIVE DEVICES UTILIZED TO PREVENT FALLS:  Life alert? No  Use of a cane, walker or w/c? No  Grab bars in the bathroom? No  Shower chair or bench in shower? Yes  Elevated toilet seat or a handicapped toilet? No    Cognitive Function:    Normal cognitive status assessed by direct observation by this Nurse Health Advisor. No abnormalities found.      Immunizations Immunization History  Administered Date(s) Administered   Influenza Split 10/24/2010   Influenza Whole 11/05/2007, 10/16/2009   Influenza, High Dose Seasonal PF 02/07/2017, 09/23/2017, 12/14/2018   Influenza,inj,Quad PF,6+ Mos 11/25/2012, 11/24/2013, 12/28/2014, 12/23/2015   Influenza-Unspecified 09/23/2017   PFIZER(Purple Top)SARS-COV-2 Vaccination 02/08/2019,  03/17/2019   Pneumococcal Conjugate-13 02/08/2018   Pneumococcal Polysaccharide-23 02/27/2019   Td 01/16/2001   Tdap 12/24/2013    TDAP status: Up to date  Flu Vaccine status: Up to date  Pneumococcal vaccine status: Up to date  Covid-19 vaccine status: Completed vaccines  Qualifies for Shingles Vaccine? Yes   Zostavax completed No   Shingrix Completed?: No.    Education has been provided regarding the importance of this vaccine. Patient has been  advised to call insurance company to determine out of pocket expense if they have not yet received this vaccine. Advised may also receive vaccine at local pharmacy or Health Dept. Verbalized acceptance and understanding.  Screening Tests Health Maintenance  Topic Date Due   Hepatitis C Screening  Never done   Zoster Vaccines- Shingrix (1 of 2) Never done   COVID-19 Vaccine (4 - Booster for Pfizer series) 01/21/2020   INFLUENZA VACCINE  08/16/2020   COLONOSCOPY (Pts 45-76yrs Insurance coverage will need to be confirmed)  04/19/2022   TETANUS/TDAP  12/25/2023   HPV VACCINES  Aged Out    Health Maintenance  Health Maintenance Due  Topic Date Due   Hepatitis C Screening  Never done   Zoster Vaccines- Shingrix (1 of 2) Never done   COVID-19 Vaccine (4 - Booster for Pfizer series) 01/21/2020   INFLUENZA VACCINE  08/16/2020    Colorectal cancer screening: Type of screening: Colonoscopy. Completed 04/18/2017. Repeat every 5 years  Lung Cancer Screening: (Low Dose CT Chest recommended if Age 16-80 years, 30 pack-year currently smoking OR have quit w/in 15years.) does not qualify.   Lung Cancer Screening Referral: n/a  Additional Screening:  Hepatitis C Screening: does qualify;   Vision Screening: Recommended annual ophthalmology exams for early detection of glaucoma and other disorders of the eye. Is the patient up to date with their annual eye exam?  Yes  Who is the provider or what is the name of the office in which the patient  attends annual eye exams? [facility] care  If pt is not established with a provider, would they like to be referred to a provider to establish care? No .   Dental Screening: Recommended annual dental exams for proper oral hygiene  Community Resource Referral / Chronic Care Management: CRR required this visit?  No   CCM required this visit?  No      Plan:     I have personally reviewed and noted the following in the patient's chart:   Medical and social history Use of alcohol, tobacco or illicit drugs  Current medications and supplements including opioid prescriptions. Patient is not currently taking opioid prescriptions. Functional ability and status Nutritional status Physical activity Advanced directives List of other physicians Hospitalizations, surgeries, and ER visits in previous 12 months Vitals Screenings to include cognitive, depression, and falls Referrals and appointments  In addition, I have reviewed and discussed with patient certain preventive protocols, quality metrics, and best practice recommendations. A written personalized care plan for preventive services as well as general preventive health recommendations were provided to patient.     Randel Pigg, LPN   [id number]   Nurse Notes: none

## 2020-10-27 ENCOUNTER — Telehealth: Payer: Self-pay | Admitting: Pharmacist

## 2020-10-27 NOTE — Chronic Care Management (AMB) (Signed)
Chronic Care Management Pharmacy Assistant   Name: Bobby Cuevas  MRN: [id number] DOB: 1950/04/16  Reason for Encounter: Disease State/ Hypertension Assessment Call.    Conditions to be addressed/monitored: HTN  Recent office visits:  None.  Recent consult visits:  None.  Hospital visits:  None in previous 6 months  Medications: Outpatient Encounter Medications as of 10/27/2020  Medication Sig   aspirin 81 MG tablet Take 81 mg by mouth daily.   doxazosin (CARDURA) 8 MG tablet Take 1 tablet (8 mg total) by mouth at bedtime.   hydrochlorothiazide (MICROZIDE) 12.5 MG capsule Take 1 capsule (12.5 mg total) by mouth in the morning.   losartan (COZAAR) 25 MG tablet Take 1 tablet (25 mg total) by mouth daily.   sertraline (ZOLOFT) 50 MG tablet Take 1 tablet (50 mg total) by mouth daily.   Facility-Administered Encounter Medications as of 10/27/2020  Medication   0.9 %  sodium chloride infusion   Fill History: DOXAZOSIN MESYLATE 8 MG TAB 10/08/2020 90   LOSARTAN POTASSIUM 25 MG TAB 10/20/2020 90   SERTRALINE HCL 50 MG TABLET 10/08/2020 90   HYDROCHLOROTHIAZIDE 12.5 MG CP 09/13/2020 90   Reviewed chart prior to disease state call. Spoke with patient regarding BP  Recent Office Vitals: BP Readings from Last 3 Encounters:  03/02/20 132/76  11/12/19 118/60  02/27/19 110/60   Pulse Readings from Last 3 Encounters:  03/02/20 (!) 50  11/12/19 69  02/27/19 64    Wt Readings from Last 3 Encounters:  03/02/20 180 lb 3.2 oz (81.7 kg)  11/12/19 181 lb 3.2 oz (82.2 kg)  02/27/19 180 lb 6.4 oz (81.8 kg)     Kidney Function Lab Results  Component Value Date/Time   CREATININE 0.98 03/02/2020 10:09 AM   CREATININE 0.95 02/27/2019 10:07 AM   CREATININE 0.84 04/16/2015 11:32 AM   GFR 79.43 03/02/2020 10:09 AM   GFRNONAA 92.44 01/05/2009 08:59 AM   GFRAA 113 10/30/2007 09:11 AM    BMP Latest Ref Rng & Units 03/02/2020 02/27/2019 02/07/2017  Glucose 70 - 99 mg/dL 84  81 85  BUN 6 - 23 mg/dL 15 17 25(H)  Creatinine 0.40 - 1.50 mg/dL 0.98 0.95 0.95  Sodium 135 - 145 mEq/L 143 142 143  Potassium 3.5 - 5.1 mEq/L 4.8 4.0 3.8  Chloride 96 - 112 mEq/L 107 107 105  CO2 19 - 32 mEq/L 29 28 27   Calcium 8.4 - 10.5 mg/dL 9.1 8.9 9.1    Current antihypertensive regimen:  Doxazosin 8mg  - take 1 tablet by mouth at bedtime. Hydrochlorothiazide 12.5mg  - take 1 capsule by mouth in the morning Losartan 25mg  - take 1 tablet by mouth daily.  How often are you checking your Blood Pressure?  Current home BP readings:  What recent interventions/DTPs have been made by any provider to improve Blood Pressure control since last CPP Visit: None. Any recent hospitalizations or ED visits since last visit with CPP? No  Adherence Review: Is the patient currently on ACE/ARB medication? Yes Does the patient have >5 day gap between last estimated fill dates? No  Multiple unsuccessful attempts to reach patient by phone.  Care Gaps:  AWV - completed on 10/18/20 Last PCP BP: 132/76 P: 51 on 03/02/20 Hepatitis C screening - never done Zoster vaccines - never done Covid-19 vaccine booster 4 - overdue since 01/21/20 Flu vaccine - due   Star Rating Drugs:  Losartan 25mg  - last filled on 10/20/20 90DS at [facility]  Clinical Pharmacist Assistant [phone]

## 2020-11-05 NOTE — Telephone Encounter (Cosign Needed)
3rd and final attempt.

## 2020-11-18 DIAGNOSIS — Z23 Encounter for immunization: Secondary | ICD-10-CM | POA: Diagnosis not present

## 2020-11-24 ENCOUNTER — Encounter: Payer: Self-pay | Admitting: Family Medicine

## 2020-11-24 ENCOUNTER — Ambulatory Visit (INDEPENDENT_AMBULATORY_CARE_PROVIDER_SITE_OTHER): Payer: Medicare Other | Admitting: Family Medicine

## 2020-11-24 ENCOUNTER — Other Ambulatory Visit: Payer: Self-pay

## 2020-11-24 VITALS — BP 130/76 | HR 74 | Temp 99.0°F | Wt 182.0 lb

## 2020-11-24 DIAGNOSIS — N529 Male erectile dysfunction, unspecified: Secondary | ICD-10-CM | POA: Diagnosis not present

## 2020-11-24 MED ORDER — SILDENAFIL CITRATE 100 MG PO TABS: 50.0000 mg | ORAL_TABLET | ORAL | 11 refills | Status: DC | PRN

## 2020-11-24 NOTE — Progress Notes (Signed)
   Subjective:    Patient ID: DEMONT LINFORD, male    DOB: 12-31-1950, 70 y.o.   MRN: [id number]  HPI Here asking to try Viagra. He has a new girlfriend and he has had some ED issues in the past.    Review of Systems  Constitutional: Negative.   Respiratory: Negative.    Cardiovascular: Negative.       Objective:   Physical Exam Constitutional:      Appearance: Normal appearance.  Cardiovascular:     Rate and Rhythm: Normal rate and regular rhythm.     Pulses: Normal pulses.     Heart sounds: Normal heart sounds.  Pulmonary:     Effort: Pulmonary effort is normal.     Breath sounds: Normal breath sounds.  Neurological:     Mental Status: He is alert.          Assessment & Plan:  ED, try Viagra 100 mg as needed. [name], MD

## 2020-11-26 ENCOUNTER — Telehealth: Payer: Self-pay | Admitting: Pharmacist

## 2020-11-26 NOTE — Progress Notes (Signed)
Chronic Care Management Pharmacy Assistant   Name: Bobby Cuevas  MRN: [id number] DOB: 1950-12-06  Reason for Encounter: Disease State / Hypertension Assessment Call   Conditions to be addressed/monitored: HTN  Recent office visits:  11/24/2020 [name] MD (PCP) - Patient was seen for erectile dysfunction. Started on Sildenafil 50/100 mg as needed. No follow up noted.  Recent consult visits:  None  Hospital visits:  None  Medications: Outpatient Encounter Medications as of 11/26/2020  Medication Sig   aspirin 81 MG tablet Take 81 mg by mouth daily.   doxazosin (CARDURA) 8 MG tablet Take 1 tablet (8 mg total) by mouth at bedtime.   hydrochlorothiazide (MICROZIDE) 12.5 MG capsule Take 1 capsule (12.5 mg total) by mouth in the morning.   losartan (COZAAR) 25 MG tablet Take 1 tablet (25 mg total) by mouth daily.   sertraline (ZOLOFT) 50 MG tablet Take 1 tablet (50 mg total) by mouth daily.   sildenafil (VIAGRA) 100 MG tablet Take 0.5-1 tablets (50-100 mg total) by mouth as needed for erectile dysfunction.   Facility-Administered Encounter Medications as of 11/26/2020  Medication   0.9 %  sodium chloride infusion   Fill History:  DOXAZOSIN MESYLATE 8 MG TAB 10/08/2020 90    LOSARTAN POTASSIUM 25 MG TAB 10/20/2020 90    SERTRALINE HCL 50 MG TABLET 10/08/2020 90    HYDROCHLOROTHIAZIDE 12.5 MG CP 09/13/2020 90   Reviewed chart prior to disease state call. Spoke with patient regarding BP  Recent Office Vitals: BP Readings from Last 3 Encounters:  11/24/20 130/76  03/02/20 132/76  11/12/19 118/60   Pulse Readings from Last 3 Encounters:  11/24/20 74  03/02/20 (!) 50  11/12/19 69    Wt Readings from Last 3 Encounters:  11/24/20 182 lb (82.6 kg)  03/02/20 180 lb 3.2 oz (81.7 kg)  11/12/19 181 lb 3.2 oz (82.2 kg)     Kidney Function Lab Results  Component Value Date/Time   CREATININE 0.98 03/02/2020 10:09 AM   CREATININE 0.95 02/27/2019 10:07 AM    CREATININE 0.84 04/16/2015 11:32 AM   GFR 79.43 03/02/2020 10:09 AM   GFRNONAA 92.44 01/05/2009 08:59 AM   GFRAA 113 10/30/2007 09:11 AM    BMP Latest Ref Rng & Units 03/02/2020 02/27/2019 02/07/2017  Glucose 70 - 99 mg/dL 84 81 85  BUN 6 - 23 mg/dL 15 17 25(H)  Creatinine 0.40 - 1.50 mg/dL 0.98 0.95 0.95  Sodium 135 - 145 mEq/L 143 142 143  Potassium 3.5 - 5.1 mEq/L 4.8 4.0 3.8  Chloride 96 - 112 mEq/L 107 107 105  CO2 19 - 32 mEq/L 29 28 27   Calcium 8.4 - 10.5 mg/dL 9.1 8.9 9.1    Current antihypertensive regimen:  Doxazosin 8mg  - take 1 tablet by mouth at bedtime. Hydrochlorothiazide 12.5mg  - take 1 capsule by mouth in the morning Losartan 25mg  - take 1 tablet by mouth daily  How often are you checking your Blood Pressure? infrequently  Current home BP readings: Patient does not recall   What recent interventions/DTPs have been made by any provider to improve Blood Pressure control since last CPP Visit: None  Any recent hospitalizations or ED visits since last visit with CPP? No  What diet changes have been made to improve Blood Pressure Control?  Patient has a muffin for breakfast, sandwich for lunch and frozen meals for dinner  What exercise is being done to improve your Blood Pressure Control?  Patient is out an about daily,  gets plenty of walking but nothing structured.  Adherence Review: Is the patient currently on ACE/ARB medication? Yes Does the patient have >5 day gap between last estimated fill dates? No   Care Gaps: AWV - completed on 10/18/20 Last PCP BP: 132/76 P: 50 on 03/02/20 Hepatitis C screening - never done Zoster vaccines - never done Covid-19 vaccine booster 4 - overdue since 01/21/20 Flu vaccine - due Last BP - 130/76 on 11/24/2020   Star Rating Drugs: Losartan 25mg  - last filled on 10/20/20 90DS at [place] [phone]

## 2021-01-02 ENCOUNTER — Other Ambulatory Visit: Payer: Self-pay | Admitting: Family Medicine

## 2021-02-07 ENCOUNTER — Telehealth: Payer: Self-pay | Admitting: Pharmacist

## 2021-02-07 NOTE — Chronic Care Management (AMB) (Signed)
Chronic Care Management Pharmacy Assistant   Name: Bobby Cuevas  MRN: [id number] DOB: 02/25/1950  Reason for Encounter: Disease State / Hypertension Assessment Call   Conditions to be addressed/monitored: HTN  Recent office visits:  None  Recent consult visits:  None  Hospital visits:  None  Medications: Outpatient Encounter Medications as of 02/07/2021  Medication Sig   aspirin 81 MG tablet Take 81 mg by mouth daily.   doxazosin (CARDURA) 8 MG tablet TAKE 1 TABLET BY MOUTH AT BEDTIME.   hydrochlorothiazide (MICROZIDE) 12.5 MG capsule Take 1 capsule (12.5 mg total) by mouth in the morning.   losartan (COZAAR) 25 MG tablet Take 1 tablet (25 mg total) by mouth daily.   sertraline (ZOLOFT) 50 MG tablet TAKE 1 TABLET BY MOUTH EVERY DAY   sildenafil (VIAGRA) 100 MG tablet Take 0.5-1 tablets (50-100 mg total) by mouth as needed for erectile dysfunction.   Facility-Administered Encounter Medications as of 02/07/2021  Medication   0.9 %  sodium chloride infusion  Fill History: DOXAZOSIN MESYLATE 8 MG TAB 10/08/2020 90   LOSARTAN POTASSIUM 25 MG TAB 10/20/2020 90   SERTRALINE HCL 50 MG TABLET 10/08/2020 90   SILDENAFIL 100 MG TABLET 11/24/2020 10   HYDROCHLOROTHIAZIDE 12.5 MG CP 12/14/2020 90    Reviewed chart prior to disease state call. Spoke with patient regarding BP  Recent Office Vitals: BP Readings from Last 3 Encounters:  11/24/20 130/76  03/02/20 132/76  11/12/19 118/60   Pulse Readings from Last 3 Encounters:  11/24/20 74  03/02/20 (!) 50  11/12/19 69    Wt Readings from Last 3 Encounters:  11/24/20 182 lb (82.6 kg)  03/02/20 180 lb 3.2 oz (81.7 kg)  11/12/19 181 lb 3.2 oz (82.2 kg)     Kidney Function Lab Results  Component Value Date/Time   CREATININE 0.98 03/02/2020 10:09 AM   CREATININE 0.95 02/27/2019 10:07 AM   CREATININE 0.84 04/16/2015 11:32 AM   GFR 79.43 03/02/2020 10:09 AM   GFRNONAA 92.44 01/05/2009 08:59 AM   GFRAA 113  10/30/2007 09:11 AM    BMP Latest Ref Rng & Units 03/02/2020 02/27/2019 02/07/2017  Glucose 70 - 99 mg/dL 84 81 85  BUN 6 - 23 mg/dL 15 17 25(H)  Creatinine 0.40 - 1.50 mg/dL 0.98 0.95 0.95  Sodium 135 - 145 mEq/L 143 142 143  Potassium 3.5 - 5.1 mEq/L 4.8 4.0 3.8  Chloride 96 - 112 mEq/L 107 107 105  CO2 19 - 32 mEq/L 29 28 27   Calcium 8.4 - 10.5 mg/dL 9.1 8.9 9.1    Current antihypertensive regimen:  Doxazosin 8mg  - take 1 tablet by mouth at bedtime. Hydrochlorothiazide 12.5mg  - take 1 capsule by mouth in the morning Losartan 25mg  - take 1 tablet by mouth daily  How often are you checking your Blood Pressure? Patient states he is checking blood pressures infrequently  Current home BP readings: Patient states when he checks his blood pressures they are always good, in the 120/80 range.  He states he will try and check more often and start keeping a log.  What recent interventions/DTPs have been made by any provider to improve Blood Pressure control since last CPP Visit: No recent interventions  Any recent hospitalizations or ED visits since last visit with CPP? No recent hospital visits.   What diet changes have been made to improve Blood Pressure Control?  Patient does not follow a specific diet Breakfast - Patient will have a muffin Lunch - Patient  will have a sandwich Dinner - Patient eats frozen meals   What exercise is being done to improve your Blood Pressure Control?  Patient is out an about daily, gets plenty of walking but nothing structured.  Adherence Review: Is the patient currently on ACE/ARB medication? Yes Does the patient have >5 day gap between last estimated fill dates? No  Care Gaps: AWV - completed on 10/18/20 Last BP - 130/76 on 11/24/2020 Hep C screen - never done Zoster vaccine - never done Covid vaccine - overdue Flu vaccine - overdue  Star Rating Drugs: Losartan 25mg  - last filled on 01/07/2021 90DS at [facility] verified with [facility] Pharmacist Assistant [phone]

## 2021-02-19 ENCOUNTER — Other Ambulatory Visit: Payer: Self-pay | Admitting: Family Medicine

## 2021-02-19 DIAGNOSIS — E7800 Pure hypercholesterolemia, unspecified: Secondary | ICD-10-CM

## 2021-02-19 DIAGNOSIS — I10 Essential (primary) hypertension: Secondary | ICD-10-CM

## 2021-03-02 ENCOUNTER — Encounter: Payer: Self-pay | Admitting: Family Medicine

## 2021-03-02 ENCOUNTER — Ambulatory Visit (INDEPENDENT_AMBULATORY_CARE_PROVIDER_SITE_OTHER): Payer: Medicare Other | Admitting: Family Medicine

## 2021-03-02 VITALS — BP 148/64 | HR 48 | Temp 98.8°F | Ht 68.0 in | Wt 181.0 lb

## 2021-03-02 DIAGNOSIS — J439 Emphysema, unspecified: Secondary | ICD-10-CM | POA: Diagnosis not present

## 2021-03-02 DIAGNOSIS — R739 Hyperglycemia, unspecified: Secondary | ICD-10-CM

## 2021-03-02 DIAGNOSIS — N138 Other obstructive and reflux uropathy: Secondary | ICD-10-CM

## 2021-03-02 DIAGNOSIS — N401 Enlarged prostate with lower urinary tract symptoms: Secondary | ICD-10-CM

## 2021-03-02 DIAGNOSIS — I10 Essential (primary) hypertension: Secondary | ICD-10-CM | POA: Diagnosis not present

## 2021-03-02 DIAGNOSIS — F419 Anxiety disorder, unspecified: Secondary | ICD-10-CM

## 2021-03-02 DIAGNOSIS — N529 Male erectile dysfunction, unspecified: Secondary | ICD-10-CM

## 2021-03-02 LAB — LIPID PANEL
Cholesterol: 176 mg/dL (ref 0–200)
HDL: 31.8 mg/dL — ABNORMAL LOW (ref >39.00)
LDL Cholesterol: 112 mg/dL — ABNORMAL HIGH (ref 0–99)
NonHDL: 144.59
Total CHOL/HDL Ratio: 6
Triglycerides: 161 mg/dL — ABNORMAL HIGH (ref 0.0–149.0)
VLDL: 32.2 mg/dL (ref 0.0–40.0)

## 2021-03-02 LAB — BASIC METABOLIC PANEL
BUN: 14 mg/dL (ref 6–23)
CO2: 26 mEq/L (ref 19–32)
Calcium: 8.7 mg/dL (ref 8.4–10.5)
Chloride: 107 mEq/L (ref 96–112)
Creatinine, Ser: 1.03 mg/dL (ref 0.40–1.50)
GFR: 74.3 mL/min (ref >60.00)
Glucose, Bld: 84 mg/dL (ref 70–99)
Potassium: 4.2 mEq/L (ref 3.5–5.1)
Sodium: 141 mEq/L (ref 135–145)

## 2021-03-02 LAB — CBC WITH DIFFERENTIAL/PLATELET
Basophils Absolute: 0 10*3/uL (ref 0.0–0.1)
Basophils Relative: 0.6 % (ref 0.0–3.0)
Eosinophils Absolute: 0.1 10*3/uL (ref 0.0–0.7)
Eosinophils Relative: 2.2 % (ref 0.0–5.0)
HCT: 44.5 % (ref 39.0–52.0)
Hemoglobin: 15 g/dL (ref 13.0–17.0)
Lymphocytes Relative: 29.1 % (ref 12.0–46.0)
Lymphs Abs: 1.9 10*3/uL (ref 0.7–4.0)
MCHC: 33.8 g/dL (ref 30.0–36.0)
MCV: 99 fl (ref 78.0–100.0)
Monocytes Absolute: 0.5 10*3/uL (ref 0.1–1.0)
Monocytes Relative: 7.6 % (ref 3.0–12.0)
Neutro Abs: 3.9 10*3/uL (ref 1.4–7.7)
Neutrophils Relative %: 60.5 % (ref 43.0–77.0)
Platelets: 117 10*3/uL — ABNORMAL LOW (ref 150.0–400.0)
RBC: 4.49 Mil/uL (ref 4.22–5.81)
RDW: 12.7 % (ref 11.5–15.5)
WBC: 6.5 10*3/uL (ref 4.0–10.5)

## 2021-03-02 LAB — HEMOGLOBIN A1C: Hgb A1c MFr Bld: 5.2 % (ref 4.6–6.5)

## 2021-03-02 LAB — HEPATIC FUNCTION PANEL
ALT: 10 U/L (ref 0–53)
AST: 13 U/L (ref 0–37)
Albumin: 4.3 g/dL (ref 3.5–5.2)
Alkaline Phosphatase: 71 U/L (ref 39–117)
Bilirubin, Direct: 0.1 mg/dL (ref 0.0–0.3)
Total Bilirubin: 0.7 mg/dL (ref 0.2–1.2)
Total Protein: 7.1 g/dL (ref 6.0–8.3)

## 2021-03-02 LAB — TSH: TSH: 2.67 u[IU]/mL (ref 0.35–5.50)

## 2021-03-02 LAB — PSA: PSA: 1.18 ng/mL (ref 0.10–4.00)

## 2021-03-02 NOTE — Progress Notes (Signed)
Subjective:    Patient ID: Bobby Cuevas, male    DOB: Feb 17, 1950, 71 y.o.   MRN: [id number]  HPI Here to follow up on issues. He is doing well. His BP is stable. His ED responds well to the Viagra. He is urinating reasonably well though he has some nocturia. His moods are excellent. He is excited about a new girlfriend who lives in [place], whom he will visit this weekend.    Review of Systems  Constitutional: Negative.   HENT: Negative.    Eyes: Negative.   Respiratory: Negative.    Cardiovascular: Negative.   Gastrointestinal: Negative.   Genitourinary: Negative.   Musculoskeletal: Negative.   Skin: Negative.   Neurological: Negative.   Psychiatric/Behavioral: Negative.        Objective:   Physical Exam Constitutional:      General: He is not in acute distress.    Appearance: Normal appearance. He is well-developed. He is not diaphoretic.  HENT:     Head: Normocephalic and atraumatic.     Right Ear: External ear normal.     Left Ear: External ear normal.     Nose: Nose normal.     Mouth/Throat:     Pharynx: No oropharyngeal exudate.  Eyes:     General: No scleral icterus.       Right eye: No discharge.        Left eye: No discharge.     Conjunctiva/sclera: Conjunctivae normal.     Pupils: Pupils are equal, round, and reactive to light.  Neck:     Thyroid: No thyromegaly.     Vascular: No JVD.     Trachea: No tracheal deviation.  Cardiovascular:     Rate and Rhythm: Normal rate and regular rhythm.     Heart sounds: Normal heart sounds. No murmur heard.   No friction rub. No gallop.  Pulmonary:     Effort: Pulmonary effort is normal. No respiratory distress.     Breath sounds: Normal breath sounds. No wheezing or rales.  Chest:     Chest wall: No tenderness.  Abdominal:     General: Bowel sounds are normal. There is no distension.     Palpations: Abdomen is soft. There is no mass.     Tenderness: There is no abdominal tenderness. There is no  guarding or rebound.  Genitourinary:    Penis: Normal. No tenderness.      Testes: Normal.     Prostate: Normal.     Rectum: Normal. Guaiac result negative.  Musculoskeletal:        General: No tenderness. Normal range of motion.     Cervical back: Neck supple.  Lymphadenopathy:     Cervical: No cervical adenopathy.  Skin:    General: Skin is warm and dry.     Coloration: Skin is not pale.     Findings: No erythema or rash.  Neurological:     Mental Status: He is alert and oriented to person, place, and time.     Cranial Nerves: No cranial nerve deficit.     Motor: No abnormal muscle tone.     Coordination: Coordination normal.     Deep Tendon Reflexes: Reflexes are normal and symmetric. Reflexes normal.  Psychiatric:        Behavior: Behavior normal.        Thought Content: Thought content normal.        Judgment: Judgment normal.          Assessment &  Plan:  His anxiety is stable. His HTN is well controlled. The BPH and ED are stable. We will check fasting labs today. We spent a total of ( 31  ) minutes reviewing records and discussing these issues.  [name], MD  [name], MD

## 2021-03-10 DIAGNOSIS — H25013 Cortical age-related cataract, bilateral: Secondary | ICD-10-CM | POA: Diagnosis not present

## 2021-03-10 DIAGNOSIS — H01024 Squamous blepharitis left upper eyelid: Secondary | ICD-10-CM | POA: Diagnosis not present

## 2021-03-10 DIAGNOSIS — H5203 Hypermetropia, bilateral: Secondary | ICD-10-CM | POA: Diagnosis not present

## 2021-03-10 DIAGNOSIS — H01021 Squamous blepharitis right upper eyelid: Secondary | ICD-10-CM | POA: Diagnosis not present

## 2021-05-23 ENCOUNTER — Telehealth: Payer: Medicare Other

## 2021-05-24 ENCOUNTER — Ambulatory Visit (INDEPENDENT_AMBULATORY_CARE_PROVIDER_SITE_OTHER): Payer: Medicare Other | Admitting: Family Medicine

## 2021-05-24 ENCOUNTER — Encounter: Payer: Self-pay | Admitting: Family Medicine

## 2021-05-24 VITALS — BP 128/76 | HR 55 | Temp 99.0°F | Wt 178.0 lb

## 2021-05-24 DIAGNOSIS — J019 Acute sinusitis, unspecified: Secondary | ICD-10-CM

## 2021-05-24 DIAGNOSIS — B356 Tinea cruris: Secondary | ICD-10-CM

## 2021-05-24 MED ORDER — AZITHROMYCIN 250 MG PO TABS: ORAL_TABLET | ORAL | 0 refills | Status: DC

## 2021-05-24 NOTE — Progress Notes (Signed)
? ?  Subjective:  ? ? Patient ID: Bobby Cuevas, male    DOB: [birth year], 71 y.o.   MRN: [id number] ? ?HPI ?Here for 2 issues. First for the past week he has had sinus pressure, pain in the face, PND, and a dry cough. He thinks he has had a low grade fever. Also for several days he has had itching on the head of his penis. No burning or DC. His last sexual encounter was about 4 weeks ago.  ? ? ?Review of Systems  ?Constitutional:  Positive for fever.  ?HENT:  Positive for congestion, ear pain, postnasal drip, sinus pressure and sore throat.   ?Eyes: Negative.   ?Respiratory:  Positive for cough.   ? ?   ?Objective:  ? Physical Exam ?Constitutional:   ?   Appearance: Normal appearance. He is not ill-appearing.  ?HENT:  ?   Right Ear: Tympanic membrane, ear canal and external ear normal.  ?   Left Ear: Tympanic membrane, ear canal and external ear normal.  ?   Nose: Nose normal.  ?   Mouth/Throat:  ?   Mouth: Mucous membranes are moist.  ?   Pharynx: Oropharynx is clear.  ?Eyes:  ?   Conjunctiva/sclera: Conjunctivae normal.  ?Pulmonary:  ?   Effort: Pulmonary effort is normal.  ?   Breath sounds: Normal breath sounds.  ?Genitourinary: ?   Testes: Normal.  ?   Comments: No rashes. There is a mild erythema to the glans  ?Lymphadenopathy:  ?   Cervical: No cervical adenopathy.  ?Neurological:  ?   Mental Status: He is alert.  ? ? ? ? ? ?   ?Assessment & Plan:  ?Sinusitis, treat with a Zpack. Add Mucinex as needed. He also has some tinea cruris. He can apply Lamisil cream as needed.  ?[name], MD ? ? ?

## 2021-09-30 ENCOUNTER — Telehealth: Payer: Self-pay | Admitting: Family Medicine

## 2021-09-30 NOTE — Telephone Encounter (Signed)
Left message for patient to call back and schedule Medicare Annual Wellness Visit (AWV) either virtually or in office. Left  my Bobby Cuevas number [phone]   Last AWV 10/18/20 ; please schedule at anytime with [facility] Nurse Health Advisor 1 or 2

## 2021-10-19 ENCOUNTER — Ambulatory Visit (INDEPENDENT_AMBULATORY_CARE_PROVIDER_SITE_OTHER): Payer: Medicare Other

## 2021-10-19 VITALS — Ht 70.0 in | Wt 178.0 lb

## 2021-10-19 DIAGNOSIS — Z0000 Encounter for general adult medical examination without abnormal findings: Secondary | ICD-10-CM

## 2021-10-19 NOTE — Progress Notes (Signed)
Subjective:   Bobby Cuevas is a 71 y.o. male who presents for Medicare Annual/Subsequent preventive examination.  Review of Systems    Virtual Visit via Telephone Note  I connected with  Bobby Cuevas on 10/19/21 at  9:00 AM EDT by telephone and verified that I am speaking with the correct person using two identifiers.  Location: Patient: Home Provider: Office Persons participating in the virtual visit: patient/Nurse Health Advisor   I discussed the limitations, risks, security and privacy concerns of performing an evaluation and management service by telephone and the availability of in person appointments. The patient expressed understanding and agreed to proceed.  Interactive audio and video telecommunications were attempted between this nurse and patient, however failed, due to patient having technical difficulties OR patient did not have access to video capability.  We continued and completed visit with audio only.  Some vital signs may be absent or patient reported.   Criselda Peaches, LPN  Cardiac Risk Factors include: advanced age (>15mn, >>54women);hypertension;male gender;smoking/ tobacco exposure;Other (see comment) (Dx COPD)     Objective:    Today's Vitals   10/19/21 0857  Weight: 178 lb (80.7 kg)  Height: '5\' 10"'$  (1.778 m)   Body mass index is 25.54 kg/m.     10/19/2021    9:06 AM 10/18/2020    3:31 PM 10/16/2019    2:26 PM  Advanced Directives  Does Patient Have a Medical Advance Directive? No Yes Yes  Type of [place]ate [place]  Does patient want to make changes to medical advance directive?   No - Patient declined  Copy of [place] Chart?  No - copy requested No - copy requested  Would patient like information on creating a medical advance directive? No - Patient declined      Current Medications (verified) Outpatient Encounter  Medications as of 10/19/2021  Medication Sig   aspirin 81 MG tablet Take 81 mg by mouth daily.   azithromycin (ZITHROMAX Z-PAK) 250 MG tablet As directed   doxazosin (CARDURA) 8 MG tablet TAKE 1 TABLET BY MOUTH AT BEDTIME.   hydrochlorothiazide (MICROZIDE) 12.5 MG capsule TAKE 1 CAPSULE (12.5 MG TOTAL) BY MOUTH IN THE MORNING.   losartan (COZAAR) 25 MG tablet TAKE 1 TABLET (25 MG TOTAL) BY MOUTH DAILY.   sertraline (ZOLOFT) 50 MG tablet TAKE 1 TABLET BY MOUTH EVERY DAY   sildenafil (VIAGRA) 100 MG tablet Take 0.5-1 tablets (50-100 mg total) by mouth as needed for erectile dysfunction.   Facility-Administered Encounter Medications as of 10/19/2021  Medication   0.9 %  sodium chloride infusion    Allergies (verified) Wellbutrin [bupropion]   History: Past Medical History:  Diagnosis Date   Anxiety    [place]    emphysema on [place]    Hyperlipidemia    Hypertension    Tobacco abuse    Past Surgical History:  Procedure Laterality Date   APPENDECTOMY     COLONOSCOPY  04/18/2017   per Dr. JArdis Hughs adenomatous polyps, repeat in 5 yrs    REDUCTION OF TORSION OF TESTIS  at age 71   SPINESURGERY     Family History  Problem Relation Age of Onset   Lung disease Father    Lung cancer Father    Alcohol abuse Other    Hypertension Other    Cancer Other    Lung disease Other  Colon cancer Neg Hx    Esophageal cancer Neg Hx    Stomach cancer Neg Hx    Rectal cancer Neg Hx    Liver cancer Neg Hx    Pancreatic cancer Neg Hx    Prostate cancer Neg Hx    Social History   Socioeconomic History   Marital status: Legally Separated    Spouse name: Not on file   Number of children: Not on file   Years of education: Not on file   Highest education level: Not on file  Occupational History   Not on file  Tobacco Use   Smoking status: Every Day    Packs/day: 1.00    Years: 48.00    Total pack years: 48.00    Types: Cigarettes   Smokeless  tobacco: Never   Tobacco comments:    Counseled on smoking cessation  Vaping Use   Vaping Use: Never used  Substance and Sexual Activity   Alcohol use: Yes    Alcohol/week: 0.0 standard drinks of alcohol    Comment: 1-2 beers per month per pt   Drug use: No   Sexual activity: Yes  Other Topics Concern   Not on file  Social History Narrative   Not on file   Social Determinants of Health   Financial Resource Strain: Low Risk  (10/19/2021)   Overall Financial Resource Strain (CARDIA)    Difficulty of Paying Living Expenses: Not hard at all  Food Insecurity: No Food Insecurity (10/19/2021)   Hunger Vital Sign    Worried About Running Out of Food in the Last Year: Never true    Ran Out of Food in the Last Year: Never true  Transportation Needs: No Transportation Needs (10/19/2021)   PRAPARE - Hydrologist (Medical): No    Lack of Transportation (Non-Medical): No  Physical Activity: Inactive (10/19/2021)   Exercise Vital Sign    Days of Exercise per Week: 0 days    Minutes of Exercise per Session: 0 min  Stress: No Stress Concern Present (10/19/2021)   [place]    Feeling of Stress : Not at all  Social Connections: Moderately Integrated (10/19/2021)   Social Connection and Isolation Panel [NHANES]    Frequency of Communication with Friends and Family: More than three times a week    Frequency of Social Gatherings with Friends and Family: More than three times a week    Attends Religious Services: More than 4 times per year    Active Member of [employer] or Organizations: Yes    Attends Music therapist: More than 4 times per year    Marital Status: Divorced    Tobacco Counseling Ready to quit: No Counseling given: Yes Tobacco comments: Counseled on smoking cessation   Clinical Intake:  Pre-visit preparation completed: No  Pain : No/denies pain     BMI - recorded:  25.54 Nutritional Status: BMI 25 -29 Overweight Nutritional Risks: None Diabetes: No  How often do you need to have someone help you when you read instructions, pamphlets, or other written materials from your doctor or pharmacy?: 1 - Never  Diabetic?  No  Interpreter Needed?: No  Information entered by :: Rolene Arbour LPN   Activities of Daily Living    10/19/2021    9:04 AM 11/24/2020    3:54 PM  In your present state of health, do you have any difficulty performing the following activities:  Hearing? 0  0  Vision? 0 0  Difficulty concentrating or making decisions? 0 0  Walking or climbing stairs? 0 0  Dressing or bathing? 0 0  Doing errands, shopping? 0 0  Preparing Food and eating ? N   Using the Toilet? N   In the past six months, have you accidently leaked urine? N   Do you have problems with loss of bowel control? N   Managing your Medications? N   Managing your Finances? N   Housekeeping or managing your Housekeeping? N     Patient Care Team: Laurey Morale, MD as PCP - General (Family Medicine) Viona Gilmore, [facility] as Pharmacist (Pharmacist)  Indicate any recent Medical Services you may have received from other than [facility] providers in the past year (date may be approximate).     Assessment:   This is a routine wellness examination for Zubair.  Hearing/Vision screen Hearing Screening - Comments:: Denies hearing difficulties   Vision Screening - Comments:: Wears rx glasses - up to date with routine eye exams with  Dr Peter Garter  Dietary issues and exercise activities discussed: Current Exercise Habits: The patient does not participate in regular exercise at present, Exercise limited by: None identified   Goals Addressed               This Visit's Progress     Get re elected (pt-stated)         Depression Screen    10/19/2021    9:02 AM 05/24/2021    4:15 PM 03/02/2021   10:19 AM 11/24/2020    3:55 PM 10/18/2020    3:32 PM 10/18/2020    3:27 PM  10/16/2019    2:33 PM  PHQ 2/9 Scores  PHQ - 2 Score 0 0 0 0 0 0 0  PHQ- 9 Score  1 0 0   0    Fall Risk    10/19/2021    9:05 AM 03/02/2021   10:19 AM 11/24/2020    3:54 PM 10/18/2020    3:32 PM 10/16/2019    2:29 PM  Fall Risk   Falls in the past year? 0 0 0 0 0  Number falls in past yr: 0 0 0 0 0  Injury with Fall? 0 0 0 0 0  Risk for fall due to : No Fall Risks No Fall Risks No Fall Risks  No Fall Risks  Follow up Falls prevention discussed   Falls evaluation completed Falls evaluation completed;Falls prevention discussed    FALL RISK PREVENTION PERTAINING TO THE HOME:  Any stairs in or around the home? Yes  If so, are there any without handrails? No  Home free of loose throw rugs in walkways, pet beds, electrical cords, etc? Yes  Adequate lighting in your home to reduce risk of falls? Yes   ASSISTIVE DEVICES UTILIZED TO PREVENT FALLS:  Life alert? No  Use of a cane, walker or w/c? No  Grab bars in the bathroom? No  Shower chair or bench in shower? Yes  Elevated toilet seat or a handicapped toilet? No   TIMED UP AND GO:  Was the test performed? No . Audio Visit  Cognitive Function:        10/19/2021    9:06 AM  6CIT Screen  What Year? 0 points  What month? 0 points  What time? 0 points  Count back from 20 0 points  Months in reverse 0 points  Repeat phrase 0 points  Total Score 0 points  Immunizations Immunization History  Administered Date(s) Administered   Influenza Split 10/24/2010   Influenza Whole 11/05/2007, 10/16/2009   Influenza, High Dose Seasonal PF 02/07/2017, 09/23/2017, 12/14/2018   Influenza,inj,Quad PF,6+ Mos 11/25/2012, 11/24/2013, 12/28/2014, 12/23/2015   Influenza-Unspecified 09/23/2017, 11/10/2020   PFIZER(Purple Top)SARS-COV-2 Vaccination 02/08/2019, 03/17/2019   Pneumococcal Conjugate-13 02/08/2018   Pneumococcal Polysaccharide-23 02/27/2019   Td 01/16/2001   Tdap 12/24/2013    TDAP status: Up to date  Flu Vaccine status:  Up to date  Pneumococcal vaccine status: Up to date  Covid-19 vaccine status: Completed vaccines  Qualifies for Shingles Vaccine? Yes   Zostavax completed No   Shingrix Completed?: No.    Education has been provided regarding the importance of this vaccine. Patient has been advised to call insurance company to determine out of pocket expense if they have not yet received this vaccine. Advised may also receive vaccine at local pharmacy or Health Dept. Verbalized acceptance and understanding.  Screening Tests Health Maintenance  Topic Date Due   Hepatitis C Screening  Never done   COVID-19 Vaccine (4 - Pfizer series) 11/04/2021 (Originally 12/24/2019)   Zoster Vaccines- Shingrix (1 of 2) 03/16/2022 (Originally 01/03/2002)   INFLUENZA VACCINE  04/16/2022 (Originally 08/16/2021)   COLONOSCOPY (Pts 45-37yr Insurance coverage will need to be confirmed)  04/19/2022   TETANUS/TDAP  12/25/2023   Pneumonia Vaccine 70 Years old  Completed   HPV VACCINES  Aged Out    Health Maintenance  Health Maintenance Due  Topic Date Due   Hepatitis C Screening  Never done    Colorectal cancer screening: Type of screening: Colonoscopy. Completed 04/18/17. Repeat every 5 years  Lung Cancer Screening: (Low Dose CT Chest recommended if Age 70-80years, 30 pack-year currently smoking OR have quit w/in 15years.) does qualify.   Lung Cancer Screening Referral: Patient deferred  Additional Screening:  Hepatitis C Screening: does qualify; Completed Deferred  Vision Screening: Recommended annual ophthalmology exams for early detection of glaucoma and other disorders of the eye. Is the patient up to date with their annual eye exam?  Yes  Who is the provider or what is the name of the office in which the patient attends annual eye exams? Dr KPeter GarterIf pt is not established with a provider, would they like to be referred to a provider to establish care? No .   Dental Screening: Recommended annual dental exams for  proper oral hygiene  Community Resource Referral / Chronic Care Management:  CRR required this visit?  No   CCM required this visit?  No      Plan:     I have personally reviewed and noted the following in the patient's chart:   Medical and social history Use of alcohol, tobacco or illicit drugs  Current medications and supplements including opioid prescriptions. Patient is not currently taking opioid prescriptions. Functional ability and status Nutritional status Physical activity Advanced directives List of other physicians Hospitalizations, surgeries, and ER visits in previous 12 months Vitals Screenings to include cognitive, depression, and falls Referrals and appointments  In addition, I have reviewed and discussed with patient certain preventive protocols, quality metrics, and best practice recommendations. A written personalized care plan for preventive services as well as general preventive health recommendations were provided to patient.     BCriselda Peaches LPN   [id number]  Nurse Notes: Patient due Hep-C Screening

## 2021-10-19 NOTE — Patient Instructions (Addendum)
Bobby Cuevas , Thank you for taking time to come for your Medicare Wellness Visit. I appreciate your ongoing commitment to your health goals. Please review the following plan we discussed and let me know if I can assist you in the future.   These are the goals we discussed:  Goals       Exercise 3x per week (30 min per time)      Get re elected (pt-stated)        This is a list of the screening recommended for you and due dates:  Health Maintenance  Topic Date Due   Hepatitis C Screening: USPSTF Recommendation to screen - Ages 35-79 yo.  Never done   COVID-19 Vaccine (4 - Pfizer series) 11/04/2021*   Zoster (Shingles) Vaccine (1 of 2) 03/16/2022*   Flu Shot  04/16/2022*   Colon Cancer Screening  04/19/2022   Tetanus Vaccine  12/25/2023   Pneumonia Vaccine  Completed   HPV Vaccine  Aged Out  *Topic was postponed. The date shown is not the original due date.    Advanced directives: Advance directive discussed with you today. Even though you declined this today, please call our office should you change your mind, and we can give you the proper paperwork for you to fill out.   Conditions/risks identified: None  Next appointment: Follow up in one year for your annual wellness visit.    Preventive Care 35 Years and Older, Male  Preventive care refers to lifestyle choices and visits with your health care provider that can promote health and wellness. What does preventive care include? A yearly physical exam. This is also called an annual well check. Dental exams once or twice a year. Routine eye exams. Ask your health care provider how often you should have your eyes checked. Personal lifestyle choices, including: Daily care of your teeth and gums. Regular physical activity. Eating a healthy diet. Avoiding tobacco and drug use. Limiting alcohol use. Practicing safe sex. Taking low doses of aspirin every day. Taking vitamin and mineral supplements as recommended by your health  care provider. What happens during an annual well check? The services and screenings done by your health care provider during your annual well check will depend on your age, overall health, lifestyle risk factors, and family history of disease. Counseling  Your health care provider may ask you questions about your: Alcohol use. Tobacco use. Drug use. Emotional well-being. Home and relationship well-being. Sexual activity. Eating habits. History of falls. Memory and ability to understand (cognition). Work and work Statistician. Screening  You may have the following tests or measurements: Height, weight, and BMI. Blood pressure. Lipid and cholesterol levels. These may be checked every 5 years, or more frequently if you are over 43 years old. Skin check. Lung cancer screening. You may have this screening every year starting at age 18 if you have a 30-pack-year history of smoking and currently smoke or have quit within the past 15 years. Fecal occult blood test (FOBT) of the stool. You may have this test every year starting at age 27. Flexible sigmoidoscopy or colonoscopy. You may have a sigmoidoscopy every 5 years or a colonoscopy every 10 years starting at age 58. Prostate cancer screening. Recommendations will vary depending on your family history and other risks. Hepatitis C blood test. Hepatitis B blood test. Sexually transmitted disease (STD) testing. Diabetes screening. This is done by checking your blood sugar (glucose) after you have not eaten for a while (fasting). You may have this  done every 1-3 years. Abdominal aortic aneurysm (AAA) screening. You may need this if you are a current or former smoker. Osteoporosis. You may be screened starting at age 23 if you are at high risk. Talk with your health care provider about your test results, treatment options, and if necessary, the need for more tests. Vaccines  Your health care provider may recommend certain vaccines, such  as: Influenza vaccine. This is recommended every year. Tetanus, diphtheria, and acellular pertussis (Tdap, Td) vaccine. You may need a Td booster every 10 years. Zoster vaccine. You may need this after age 92. Pneumococcal 13-valent conjugate (PCV13) vaccine. One dose is recommended after age 60. Pneumococcal polysaccharide (PPSV23) vaccine. One dose is recommended after age 38. Talk to your health care provider about which screenings and vaccines you need and how often you need them. This information is not intended to replace advice given to you by your health care provider. Make sure you discuss any questions you have with your health care provider. Document Released: 01/29/2015 Document Revised: 09/22/2015 Document Reviewed: 11/03/2014 Elsevier Interactive Patient Education  2017 [place] Prevention in the Home Falls can cause injuries. They can happen to people of all ages. There are many things you can do to make your home safe and to help prevent falls. What can I do on the outside of my home? Regularly fix the edges of walkways and driveways and fix any cracks. Remove anything that might make you trip as you walk through a door, such as a raised step or threshold. Trim any bushes or trees on the path to your home. Use bright outdoor lighting. Clear any walking paths of anything that might make someone trip, such as rocks or tools. Regularly check to see if handrails are loose or broken. Make sure that both sides of any steps have handrails. Any raised decks and porches should have guardrails on the edges. Have any leaves, snow, or ice cleared regularly. Use sand or salt on walking paths during winter. Clean up any spills in your garage right away. This includes oil or grease spills. What can I do in the bathroom? Use night lights. Install grab bars by the toilet and in the tub and shower. Do not use towel bars as grab bars. Use non-skid mats or decals in the tub or  shower. If you need to sit down in the shower, use a plastic, non-slip stool. Keep the floor dry. Clean up any water that spills on the floor as soon as it happens. Remove soap buildup in the tub or shower regularly. Attach bath mats securely with double-sided non-slip rug tape. Do not have throw rugs and other things on the floor that can make you trip. What can I do in the bedroom? Use night lights. Make sure that you have a light by your bed that is easy to reach. Do not use any sheets or blankets that are too big for your bed. They should not hang down onto the floor. Have a firm chair that has side arms. You can use this for support while you get dressed. Do not have throw rugs and other things on the floor that can make you trip. What can I do in the kitchen? Clean up any spills right away. Avoid walking on wet floors. Keep items that you use a lot in easy-to-reach places. If you need to reach something above you, use a strong step stool that has a grab bar. Keep electrical cords out of  the way. Do not use floor polish or wax that makes floors slippery. If you must use wax, use non-skid floor wax. Do not have throw rugs and other things on the floor that can make you trip. What can I do with my stairs? Do not leave any items on the stairs. Make sure that there are handrails on both sides of the stairs and use them. Fix handrails that are broken or loose. Make sure that handrails are as long as the stairways. Check any carpeting to make sure that it is firmly attached to the stairs. Fix any carpet that is loose or worn. Avoid having throw rugs at the top or bottom of the stairs. If you do have throw rugs, attach them to the floor with carpet tape. Make sure that you have a light switch at the top of the stairs and the bottom of the stairs. If you do not have them, ask someone to add them for you. What else can I do to help prevent falls? Wear shoes that: Do not have high heels. Have  rubber bottoms. Are comfortable and fit you well. Are closed at the toe. Do not wear sandals. If you use a stepladder: Make sure that it is fully opened. Do not climb a closed stepladder. Make sure that both sides of the stepladder are locked into place. Ask someone to hold it for you, if possible. Clearly mark and make sure that you can see: Any grab bars or handrails. First and last steps. Where the edge of each step is. Use tools that help you move around (mobility aids) if they are needed. These include: Canes. Walkers. Scooters. Crutches. Turn on the lights when you go into a dark area. Replace any light bulbs as soon as they burn out. Set up your furniture so you have a clear path. Avoid moving your furniture around. If any of your floors are uneven, fix them. If there are any pets around you, be aware of where they are. Review your medicines with your doctor. Some medicines can make you feel dizzy. This can increase your chance of falling. Ask your doctor what other things that you can do to help prevent falls. This information is not intended to replace advice given to you by your health care provider. Make sure you discuss any questions you have with your health care provider. Document Released: 10/29/2008 Document Revised: 06/10/2015 Document Reviewed: 02/06/2014 Elsevier Interactive Patient Education  2017 [employer].

## 2021-12-26 ENCOUNTER — Telehealth: Payer: Self-pay

## 2021-12-26 NOTE — Telephone Encounter (Signed)
---  Caller states he thinks he may have a sinus infection and has a bad cough, congestion. symptoms present for approx 1 week. Denies fever. he is drinking and eating Normally  12/25/2021 1:44:17 PM [place], RN, [facility] Advice Given Per Guideline HOME CARE: * You should be able to treat this at home. * It sounds like an uncomplicated cold that we can treat at home. REASSURANCE AND EDUCATION - COUGH WITH COMMON COLD SYMPTOMS: * Colds are very common and may make you feel uncomfortable. * Colds are caused by viruses, and no medicine or 'shot' will cure an uncomplicated cold. Colds are usually not serious. FOR A RUNNY NOSE - BLOW YOUR NOSE: * Nasal mucus and discharge help wash viruses and bacteria out of the nose and sinuses. * Blowing your nose helps clean out your nose. Use a handkerchief or a paper tissue. CALL BACK IF: * Fever lasts over 3 days * Nasal discharge lasts over 10 days * Earache or facial pain develops * You become worse CARE ADVICE given per Cough - Acute Productive (Adult) guideline.

## 2021-12-28 ENCOUNTER — Ambulatory Visit (INDEPENDENT_AMBULATORY_CARE_PROVIDER_SITE_OTHER): Payer: Medicare Other | Admitting: Family Medicine

## 2021-12-28 ENCOUNTER — Encounter: Payer: Self-pay | Admitting: Family Medicine

## 2021-12-28 VITALS — BP 128/78 | HR 47 | Temp 98.3°F | Wt 179.0 lb

## 2021-12-28 DIAGNOSIS — R059 Cough, unspecified: Secondary | ICD-10-CM | POA: Diagnosis not present

## 2021-12-28 DIAGNOSIS — J019 Acute sinusitis, unspecified: Secondary | ICD-10-CM | POA: Diagnosis not present

## 2021-12-28 LAB — POCT INFLUENZA A/B
Influenza A, POC: NEGATIVE
Influenza B, POC: NEGATIVE

## 2021-12-28 LAB — POC COVID19 BINAXNOW: SARS Coronavirus 2 Ag: NEGATIVE

## 2021-12-28 MED ORDER — AZITHROMYCIN 250 MG PO TABS: ORAL_TABLET | ORAL | 0 refills | Status: DC

## 2021-12-28 NOTE — Addendum Note (Signed)
Addended by: Wyvonne Lenz on: 12/28/2021 05:46 PM   Modules accepted: Orders

## 2021-12-28 NOTE — Progress Notes (Signed)
   Subjective:    Patient ID: Bobby Cuevas, male    DOB: 03-18-1950, 71 y.o.   MRN: [id number]  HPI Here for 2 weeks of stuffy head, PND, and a dry cough. No fever or SOB. Using Mucinex.    Review of Systems  Constitutional: Negative.   HENT:  Positive for congestion, postnasal drip and sinus pressure. Negative for ear pain and sore throat.   Eyes: Negative.   Respiratory:  Positive for cough. Negative for shortness of breath and wheezing.        Objective:   Physical Exam Constitutional:      Appearance: Normal appearance. He is not ill-appearing.  HENT:     Right Ear: Tympanic membrane, ear canal and external ear normal.     Left Ear: Tympanic membrane, ear canal and external ear normal.     Nose: Nose normal.     Mouth/Throat:     Pharynx: Oropharynx is clear.  Eyes:     Conjunctiva/sclera: Conjunctivae normal.  Pulmonary:     Effort: Pulmonary effort is normal.     Breath sounds: Normal breath sounds.  Lymphadenopathy:     Cervical: No cervical adenopathy.  Neurological:     Mental Status: He is alert.           Assessment & Plan:  Sinusitis, treat with a Zpack. [name], MD

## 2021-12-30 ENCOUNTER — Other Ambulatory Visit: Payer: Self-pay | Admitting: Family Medicine

## 2022-03-30 ENCOUNTER — Other Ambulatory Visit: Payer: Self-pay | Admitting: Family Medicine

## 2022-04-11 DIAGNOSIS — H524 Presbyopia: Secondary | ICD-10-CM | POA: Diagnosis not present

## 2022-04-11 DIAGNOSIS — H01024 Squamous blepharitis left upper eyelid: Secondary | ICD-10-CM | POA: Diagnosis not present

## 2022-04-11 DIAGNOSIS — H01021 Squamous blepharitis right upper eyelid: Secondary | ICD-10-CM | POA: Diagnosis not present

## 2022-04-11 DIAGNOSIS — H52223 Regular astigmatism, bilateral: Secondary | ICD-10-CM | POA: Diagnosis not present

## 2022-04-11 DIAGNOSIS — H5203 Hypermetropia, bilateral: Secondary | ICD-10-CM | POA: Diagnosis not present

## 2022-04-11 DIAGNOSIS — H33321 Round hole, right eye: Secondary | ICD-10-CM | POA: Diagnosis not present

## 2022-04-20 DIAGNOSIS — K080 Exfoliation of teeth due to systemic causes: Secondary | ICD-10-CM | POA: Diagnosis not present

## 2022-05-01 DIAGNOSIS — K080 Exfoliation of teeth due to systemic causes: Secondary | ICD-10-CM | POA: Diagnosis not present

## 2022-05-02 DIAGNOSIS — K080 Exfoliation of teeth due to systemic causes: Secondary | ICD-10-CM | POA: Diagnosis not present

## 2022-05-10 DIAGNOSIS — K080 Exfoliation of teeth due to systemic causes: Secondary | ICD-10-CM | POA: Diagnosis not present

## 2022-05-19 ENCOUNTER — Encounter: Payer: Self-pay | Admitting: Gastroenterology

## 2022-06-20 DIAGNOSIS — H01021 Squamous blepharitis right upper eyelid: Secondary | ICD-10-CM | POA: Diagnosis not present

## 2022-06-20 DIAGNOSIS — H01024 Squamous blepharitis left upper eyelid: Secondary | ICD-10-CM | POA: Diagnosis not present

## 2022-06-28 ENCOUNTER — Other Ambulatory Visit: Payer: Self-pay | Admitting: Family Medicine

## 2022-09-22 ENCOUNTER — Other Ambulatory Visit: Payer: Self-pay | Admitting: Family Medicine

## 2022-10-12 ENCOUNTER — Other Ambulatory Visit: Payer: Self-pay | Admitting: Family Medicine

## 2022-10-24 ENCOUNTER — Encounter: Payer: Self-pay | Admitting: Family Medicine

## 2022-10-24 ENCOUNTER — Ambulatory Visit (INDEPENDENT_AMBULATORY_CARE_PROVIDER_SITE_OTHER): Payer: Medicare Other | Admitting: Family Medicine

## 2022-10-24 DIAGNOSIS — Z0000 Encounter for general adult medical examination without abnormal findings: Secondary | ICD-10-CM

## 2022-10-24 NOTE — Patient Instructions (Addendum)
I really enjoyed getting to talk with you today! I am available on Tuesdays and Thursdays for virtual visits if you have any questions or concerns, or if I can be of any further assistance.   CHECKLIST FROM ANNUAL WELLNESS VISIT:  -Follow up (please call to schedule if not scheduled after visit):   -yearly for annual wellness visit with primary care office  Here is a list of your preventive care/health maintenance measures and the plan for each if any are due:  PLAN For any measures below that may be due:  -please call your gastroenterology office to schedule your [phone]  -Please call Kandice Robinsons in Pulmonology to schedule your Lung cancer screening: [phone] -please let us know at Dr. Claris Che office if any trouble scheduling either of theses -get your updated flu and covid vaccines - let us know when you do so that we can update them  Health Maintenance  Topic Date Due   Hepatitis C Screening  Never done   Lung Cancer Screening  05/12/2021   Colonoscopy  04/19/2022   INFLUENZA VACCINE  11/07/2022 (Originally 08/17/2022)   COVID-19 Vaccine (4 - 2023-24 season) 11/09/2022 (Originally 09/17/2022)   Zoster Vaccines- Shingrix (1 of 2) 01/24/2023 (Originally 01/03/2002)   Medicare Annual Wellness (AWV)  10/24/2023   DTaP/Tdap/Td (3 - Td or Tdap) 12/25/2023   Pneumonia Vaccine 33+ Years old  Completed   HPV VACCINES  Aged Out    -See a dentist at least yearly  -Get your eyes checked and then per your eye specialist's recommendations  -Other issues addressed today:  -when you wish to quit smoking please schedule a visit so that we can help, also you can call 1-800-QUITNOW   -I have included below further information regarding a healthy whole foods based diet, physical activity guidelines for adults, stress management and opportunities for social connections. I hope you find this information useful.    -----------------------------------------------------------------------------------------------------------------------------------------------------------------------------------------------------------------------------------------------------------  NUTRITION: -eat real food: lots of colorful vegetables (half the plate) and fruits -5-7 servings of vegetables and fruits per day (fresh or steamed is best), exp. 2 servings of vegetables with lunch and dinner and 2 servings of fruit per day. Berries and greens such as kale and collards are great choices.  -consume on a regular basis: whole grains (make sure first ingredient on label contains the word "whole"), fresh fruits, fish, nuts, seeds, healthy oils (such as olive oil, avocado oil, grape seed oil) -may eat small amounts of dairy and lean meat on occasion, but avoid processed meats such as ham, bacon, lunch meat, etc. -drink water -try to avoid fast food and pre-packaged foods, processed meat -most experts advise limiting sodium to < 2300mg  per day, should limit further is any chronic conditions such as high blood pressure, heart disease, diabetes, etc. The American Heart Association advised that < 1500mg  is is ideal -try to avoid foods that contain any ingredients with names you do not recognize  -try to avoid sugar/sweets (except for the natural sugar that occurs in fresh fruit) -try to avoid sweet drinks -try to avoid white rice, white bread, pasta (unless whole grain), white or yellow potatoes  EXERCISE GUIDELINES FOR ADULTS: -if you wish to increase your physical activity, do so gradually and with the approval of your doctor -STOP and seek medical care immediately if you have any chest pain, chest discomfort or trouble breathing when starting or increasing exercise  -move and stretch your body, legs, feet and arms when sitting for long periods -Physical activity  guidelines for optimal health in adults: -least 150 minutes per week of  aerobic exercise (can talk, but not sing) once approved by your doctor, 20-30 minutes of sustained activity or two 10 minute episodes of sustained activity every day.  -resistance training at least 2 days per week if approved by your doctor -balance exercises 3+ days per week:   Stand somewhere where you have something sturdy to hold onto if you lose balance.    1) lift up on toes, start with 5x per day and work up to 20x   2) stand and lift on leg straight out to the side so that foot is a few inches of the floor, start with 5x each side and work up to 20x each side   3) stand on one foot, start with 5 seconds each side and work up to 20 seconds on each side  If you need ideas or help with getting more active:  -Silver sneakers https://tools.silversneakers.com  -Walk with a Doc: http://www.duncan-williams.com/  -try to include resistance (weight lifting/strength building) and balance exercises twice per week: or the following link for ideas: http://castillo-powell.com/  BuyDucts.dk  STRESS MANAGEMENT: -can try meditating, or just sitting quietly with deep breathing while intentionally relaxing all parts of your body for 5 minutes daily -if you need further help with stress, anxiety or depression please follow up with your primary doctor or contact the wonderful folks at [facility]: [phone]  SOCIAL CONNECTIONS: -options in [place] if you wish to engage in more social and exercise related activities:  -Silver sneakers https://tools.silversneakers.com  -Walk with a Doc: http://www.duncan-williams.com/  -Check out the [facility] Active Adults 50+ section on the [place] [employer] (hiking clubs, book clubs, cards and games, chess, exercise classes, aquatic classes and much more) - see the website for  details: https://www.[place].gov/departments/parks-recreation/active-adults50  -YouTube has lots of exercise videos for different ages and abilities as well  -[facility] (a variety of indoor and outdoor inperson activities for adults). [phone]. [address]  -Virtual Online Classes (a variety of topics): see seniorplanet.org or call [phone]  -consider volunteering at a school, hospice center, church, senior center or elsewhere

## 2022-10-24 NOTE — Progress Notes (Signed)
PATIENT CHECK-IN and HEALTH RISK ASSESSMENT QUESTIONNAIRE:  -completed by phone/video for upcoming Medicare Preventive Visit  Pre-Visit Check-in: 1)Vitals (height, wt, BP, etc) - record in vitals section for visit on day of visit Request home vitals (wt, BP, etc.) and enter into vitals, THEN update Vital Signs SmartPhrase below at the top of the HPI. See below.  2)Review and Update Medications, Allergies PMH, Surgeries, Social history in Epic 3)Hospitalizations in the last year with date/reason? no  4)Review and Update Care Team (patient's specialists) in Epic 5) Complete PHQ9 in Epic  6) Complete Fall Screening in Epic 7)Review all Health Maintenance Due and order under PCP if not done.  Medicare Wellness Patient Questionnaire:  Answer theses question about your habits: Do you drink alcohol? Yes, occasional If yes, how many drinks do you have a day?1 beer Have you ever smoked? Yes Quit date if applicable? Currently smokes 1 pack per day  How many packs a day do/did you smoke? 1 pack per day - not interested in quitting at this time Do you use smokeless tobacco ?no Do you use an illicit drugs? no Do you exercises?  Yes, walks a lot at work at [employer].  IF so, what type and how many days/minutes per week? stretching Are you sexually active? Yes Number of partners? 1 Typical breakfast: sweet roll and coffee or cheerios and coffee  Typical lunch : out to eat, sandwich with coke Typical dinner sandwich or microwave meal Typical snacks:PB & J sandwich  Beverages: OJ, coke, coffee, or beer  Answer theses question about you: Can you perform most household chores? yes Do you find it hard to follow a conversation in a noisy room? no Do you often ask people to speak up or repeat themselves? no Do you feel that you have a problem with memory? no Do you balance your checkbook and or bank acounts? no Do you feel safe at home? yes Last dentist visit? Scheduled for tomorrow Do you need  assistance with any of the following: Please note if so No  Driving? no  Feeding yourself? no  Getting from bed to chair? No  Getting to the toilet? no  Bathing or showering? No  Dressing yourself? No  Managing money? no  Climbing a flight of stairs? No  Preparing meals? No    Do you have Advanced Directives in place (Living Will, Healthcare Power or Attorney)? Yes   Last eye Exam and location? January/February 2024, [facility] in [place]   Do you currently use prescribed or non-prescribed narcotic or opioid pain medications? no  Do you have a history or close family history of breast, ovarian, tubal or peritoneal cancer or a family member with BRCA (breast cancer susceptibility 1 and 2) gene mutations? no  Yes: Request home vitals (wt, BP, etc.) and enter into vitals, THEN update Vital Signs SmartPhrase below at the top of the HPI. See below.   Nurse/Assistant Credentials/time stamp: BDS 10/24/22 11:51am   ----------------------------------------------------------------------------------------------------------------------------------------------------------------------------------------------------------------------  Because this visit was a virtual/telehealth visit, some criteria may be missing or patient reported. Any vitals not documented were not able to be obtained and vitals that have been documented are patient reported.    MEDICARE ANNUAL PREVENTIVE CARE VISIT WITH PROVIDER (Welcome to Medicare, initial annual wellness or annual wellness exam)  Virtual Visit via Video Note  I connected with Bobby Cuevas on 10/24/22  by phone  and verified that I am speaking with the correct person using two identifiers.  Location patient:  home Location provider:work or home office Persons participating in the virtual visit: patient, provider  Concerns and/or follow up today: no concerns   See HM section in Epic for other details of completed HM.    ROS: negative  for report of fevers, unintentional weight loss, vision changes, vision loss, hearing loss or change, chest pain, sob, hemoptysis, melena, hematochezia, hematuria, falls, bleeding or bruising, thoughts of suicide or self harm, memory loss  Patient-completed extensive health risk assessment - reviewed and discussed with the patient: See Health Risk Assessment completed with patient prior to the visit either above or in recent phone note. This was reviewed in detailed with the patient today and appropriate recommendations, orders and referrals were placed as needed per Summary below and patient instructions.   Review of Medical History: -PMH, PSH, Family History and current specialty and care providers reviewed and updated and listed below   Patient Care Team: Nelwyn Salisbury, MD as PCP - General (Family Medicine) Verner Chol, [facility] (Inactive) as Pharmacist (Pharmacist)   Past Medical History:  Diagnosis Date   Anxiety    COPD (chronic obstructive pulmonary disease) (HCC)    emphysema on CT    Hyperlipidemia    Hypertension    Tobacco abuse     Past Surgical History:  Procedure Laterality Date   APPENDECTOMY     COLONOSCOPY  04/18/2017   per Dr. Christella Hartigan, adenomatous polyps, repeat in 5 yrs    REDUCTION OF TORSION OF TESTIS  at age 58   SPINE SURGERY      Social History   Socioeconomic History   Marital status: Divorced    Spouse name: Not on file   Number of children: Not on file   Years of education: Not on file   Highest education level: Not on file  Occupational History   Not on file  Tobacco Use   Smoking status: Every Day    Current packs/day: 1.00    Average packs/day: 1 pack/day for 48.0 years (48.0 ttl pk-yrs)    Types: Cigarettes   Smokeless tobacco: Never   Tobacco comments:    Counseled on smoking cessation  Vaping Use   Vaping status: Never Used  Substance and Sexual Activity   Alcohol use: Yes    Alcohol/week: 0.0 standard drinks of alcohol     Comment: 1-2 beers per month per pt   Drug use: No   Sexual activity: Yes  Other Topics Concern   Not on file  Social History Narrative   Not on file   Social Determinants of Health   Financial Resource Strain: Low Risk  (10/19/2021)   Overall Financial Resource Strain (CARDIA)    Difficulty of Paying Living Expenses: Not hard at all  Food Insecurity: No Food Insecurity (10/19/2021)   Hunger Vital Sign    Worried About Running Out of Food in the Last Year: Never true    Ran Out of Food in the Last Year: Never true  Transportation Needs: No Transportation Needs (10/19/2021)   PRAPARE - Administrator, Civil Service (Medical): No    Lack of Transportation (Non-Medical): No  Physical Activity: Inactive (10/19/2021)   Exercise Vital Sign    Days of Exercise per Week: 0 days    Minutes of Exercise per Session: 0 min  Stress: No Stress Concern Present (10/19/2021)   [employer] of Occupational Health - Occupational Stress Questionnaire    Feeling of Stress : Not at all  Social Connections: Moderately Integrated (  10/19/2021)   Social Connection and Isolation Panel [NHANES]    Frequency of Communication with Friends and Family: More than three times a week    Frequency of Social Gatherings with Friends and Family: More than three times a week    Attends Religious Services: More than 4 times per year    Active Member of [employer] or Organizations: Yes    Attends Engineer, structural: More than 4 times per year    Marital Status: Divorced  Intimate Partner Violence: Not At Risk (10/19/2021)   Humiliation, Afraid, Rape, and Kick questionnaire    Fear of Current or Ex-Partner: No    Emotionally Abused: No    Physically Abused: No    Sexually Abused: No    Family History  Problem Relation Age of Onset   Lung disease Father    Lung cancer Father    Alcohol abuse Other    Hypertension Other    Cancer Other    Lung disease Other    Colon cancer Neg Hx    Esophageal  cancer Neg Hx    Stomach cancer Neg Hx    Rectal cancer Neg Hx    Liver cancer Neg Hx    Pancreatic cancer Neg Hx    Prostate cancer Neg Hx     Current Outpatient Medications on File Prior to Visit  Medication Sig Dispense Refill   aspirin 81 MG tablet Take 81 mg by mouth daily.     azithromycin (ZITHROMAX Z-PAK) 250 MG tablet As directed 6 each 0   doxazosin (CARDURA) 8 MG tablet TAKE 1 TABLET BY MOUTH EVERYDAY AT BEDTIME 90 tablet 0   hydrochlorothiazide (MICROZIDE) 12.5 MG capsule TAKE 1 CAPSULE (12.5 MG TOTAL) BY MOUTH IN THE MORNING. 90 capsule 3   losartan (COZAAR) 25 MG tablet TAKE 1 TABLET (25 MG TOTAL) BY MOUTH DAILY. 90 tablet 0   sertraline (ZOLOFT) 50 MG tablet TAKE 1 TABLET BY MOUTH EVERY DAY 90 tablet 0   sildenafil (VIAGRA) 100 MG tablet TAKE 1/2 TO 1 TABLET (50-100 MG TOTAL) BY MOUTH AS NEEDED FOR ERECTILE DYSFUNCTION 10 tablet 11   Current Facility-Administered Medications on File Prior to Visit  Medication Dose Route Frequency Provider Last Rate Last Admin   0.9 %  sodium chloride infusion  500 mL Intravenous Once Rachael Fee, MD        Allergies  Allergen Reactions   Wellbutrin [Bupropion] Hives    Rash on feet       Physical Exam Vitals requested from patient and listed below if patient had equipment and was able to obtain at home for this virtual visit: There were no vitals filed for this visit. Estimated body mass index is 25.68 kg/m as calculated from the following:   Height as of 10/19/21: 5\' 10"  (1.778 m).   Weight as of 12/28/21: 179 lb (81.2 kg).  EKG (optional): deferred due to virtual visit  GENERAL: alert, oriented, no acute distress detected; full vision exam deferred due to pandemic and/or virtual encounter  HEENT: atraumatic, conjunttiva clear, no obvious abnormalities on inspection of external nose and ears  NECK: normal movements of the head and neck  LUNGS: on inspection no signs of respiratory distress, breathing rate appears  normal, no obvious gross SOB, gasping or wheezing  CV: no obvious cyanosis  MS: moves all visible extremities without noticeable abnormality  PSYCH/NEURO: pleasant and cooperative, no obvious depression or anxiety, speech and thought processing grossly intact, Cognitive function grossly intact  Flowsheet Row Office Visit from [facility]/28/2021 in [facility] [place]  PHQ-9 Total Score 0           10/24/2022   11:49 AM 12/28/2021   11:54 AM 10/19/2021    9:02 AM 05/24/2021    4:15 PM 03/02/2021   10:19 AM  Depression screen PHQ 2/9  Decreased Interest 0 0 0 0 0  Down, Depressed, Hopeless 0 0 0 0 0  PHQ - 2 Score 0 0 0 0 0  Altered sleeping  0  0 0  Tired, decreased energy  0  1 0  Change in appetite  0  0 0  Feeling bad or failure about yourself   0  0 0  Trouble concentrating  0  0 0  Moving slowly or fidgety/restless  0  0 0  Suicidal thoughts  0  0 0  PHQ-9 Score  0  1 0  Difficult doing work/chores  Not difficult at all  Not difficult at all        03/02/2021   10:19 AM 10/19/2021    9:05 AM 12/28/2021   11:54 AM 10/24/2022    9:24 AM 10/24/2022   11:49 AM  Fall Risk  Falls in the past year? 0 0 0 0 0  Was there an injury with Fall? 0 0 0  0  Fall Risk Category Calculator 0 0 0  0  Fall Risk Category (Retired) Low Low Low    (RETIRED) Patient Fall Risk Level Low fall risk Low fall risk Low fall risk    Patient at Risk for Falls Due to No Fall Risks No Fall Risks No Fall Risks  No Fall Risks  Fall risk Follow up  Falls prevention discussed Falls evaluation completed  Falls evaluation completed     SUMMARY AND PLAN:  Encounter for Medicare annual wellness exam   Discussed applicable health maintenance/preventive health measures and advised and referred or ordered per patient preferences: -he is due for colonoscopy and agrees to call and schedule, number provided -he is due for lung cancer screening, he agrees to call and schedule, number  provided -discussed other measures due and how to obtain each.   Health Maintenance  Topic Date Due   Hepatitis C Screening  Never done   Lung Cancer Screening  05/12/2021   Colonoscopy  04/19/2022   INFLUENZA VACCINE  11/07/2022 (Originally 08/17/2022)   COVID-19 Vaccine (4 - 2023-24 season) 11/09/2022 (Originally 09/17/2022)   Zoster Vaccines- Shingrix (1 of 2) 01/24/2023 (Originally 01/03/2002)   Medicare Annual Wellness (AWV)  10/24/2023   DTaP/Tdap/Td (3 - Td or Tdap) 12/25/2023   Pneumonia Vaccine 110+ Years old  Completed   HPV VACCINES  Aged Out    Education and counseling on the following was provided based on the above review of health and a plan/checklist for the patient, along with additional information discussed, was provided for the patient in the patient instructions :   -Provided demonstrated safe balance exercises that can be done at home to improve balance and discussed exercise guidelines for adults with include balance exercises at least 3 days per week.  See patient instuctions. -Advised and counseled on a healthy lifestyle - including the importance of a healthy diet, regular physical activity, social connections and stress management. -Reviewed patient's current diet. Advised and counseled on a whole foods based healthy diet. A summary of a healthy diet was provided in the Patient Instructions.  -reviewed patient's current physical activity level and  discussed exercise guidelines for adults. Discussed community resources and ideas for safe exercise at home to assist in meeting exercise guideline recommendations in a safe and healthy way.  -Advise yearly dental visits at minimum and regular eye exams -Advised and counseled on alcohol safe limits, risks/ tobacco use, risks of smoking and offered counseling/help - patient is currently in pre contemplation stage  Follow up: see patient instructions   Patient Instructions  I really enjoyed getting to talk with you today! I  am available on Tuesdays and Thursdays for virtual visits if you have any questions or concerns, or if I can be of any further assistance.   CHECKLIST FROM ANNUAL WELLNESS VISIT:  -Follow up (please call to schedule if not scheduled after visit):   -yearly for annual wellness visit with primary care office  Here is a list of your preventive care/health maintenance measures and the plan for each if any are due:  PLAN For any measures below that may be due:  -please call your gastroenterology office to schedule your [phone]  -Please call Kandice Robinsons in Pulmonology to schedule your Lung cancer screening: [phone] -please let us know at Dr. Claris Che office if any trouble scheduling either of theses -get your updated flu and covid vaccines - let us know when you do so that we can update them  Health Maintenance  Topic Date Due   Hepatitis C Screening  Never done   Lung Cancer Screening  05/12/2021   Colonoscopy  04/19/2022   INFLUENZA VACCINE  11/07/2022 (Originally 08/17/2022)   COVID-19 Vaccine (4 - 2023-24 season) 11/09/2022 (Originally 09/17/2022)   Zoster Vaccines- Shingrix (1 of 2) 01/24/2023 (Originally 01/03/2002)   Medicare Annual Wellness (AWV)  10/24/2023   DTaP/Tdap/Td (3 - Td or Tdap) 12/25/2023   Pneumonia Vaccine 17+ Years old  Completed   HPV VACCINES  Aged Out    -See a dentist at least yearly  -Get your eyes checked and then per your eye specialist's recommendations  -Other issues addressed today:  -when you wish to quit smoking please schedule a visit so that we can help, also you can call 1-800-QUITNOW   -I have included below further information regarding a healthy whole foods based diet, physical activity guidelines for adults, stress management and opportunities for social connections. I hope you find this information useful.    -----------------------------------------------------------------------------------------------------------------------------------------------------------------------------------------------------------------------------------------------------------  NUTRITION: -eat real food: lots of colorful vegetables (half the plate) and fruits -5-7 servings of vegetables and fruits per day (fresh or steamed is best), exp. 2 servings of vegetables with lunch and dinner and 2 servings of fruit per day. Berries and greens such as kale and collards are great choices.  -consume on a regular basis: whole grains (make sure first ingredient on label contains the word "whole"), fresh fruits, fish, nuts, seeds, healthy oils (such as olive oil, avocado oil, grape seed oil) -may eat small amounts of dairy and lean meat on occasion, but avoid processed meats such as ham, bacon, lunch meat, etc. -drink water -try to avoid fast food and pre-packaged foods, processed meat -most experts advise limiting sodium to < 2300mg  per day, should limit further is any chronic conditions such as high blood pressure, heart disease, diabetes, etc. The American Heart Association advised that < 1500mg  is is ideal -try to avoid foods that contain any ingredients with names you do not recognize  -try to avoid sugar/sweets (except for the natural sugar that occurs in fresh fruit) -try to avoid sweet drinks -try to  avoid white rice, white bread, pasta (unless whole grain), white or yellow potatoes  EXERCISE GUIDELINES FOR ADULTS: -if you wish to increase your physical activity, do so gradually and with the approval of your doctor -STOP and seek medical care immediately if you have any chest pain, chest discomfort or trouble breathing when starting or increasing exercise  -move and stretch your body, legs, feet and arms when sitting for long periods -Physical activity guidelines for optimal health in adults: -least 150 minutes per week of  aerobic exercise (can talk, but not sing) once approved by your doctor, 20-30 minutes of sustained activity or two 10 minute episodes of sustained activity every day.  -resistance training at least 2 days per week if approved by your doctor -balance exercises 3+ days per week:   Stand somewhere where you have something sturdy to hold onto if you lose balance.    1) lift up on toes, start with 5x per day and work up to 20x   2) stand and lift on leg straight out to the side so that foot is a few inches of the floor, start with 5x each side and work up to 20x each side   3) stand on one foot, start with 5 seconds each side and work up to 20 seconds on each side  If you need ideas or help with getting more active:  -Silver sneakers https://tools.silversneakers.com  -Walk with a Doc: http://www.duncan-williams.com/  -try to include resistance (weight lifting/strength building) and balance exercises twice per week: or the following link for ideas: http://castillo-powell.com/  BuyDucts.dk  STRESS MANAGEMENT: -can try meditating, or just sitting quietly with deep breathing while intentionally relaxing all parts of your body for 5 minutes daily -if you need further help with stress, anxiety or depression please follow up with your primary doctor or contact the wonderful folks at [facility]: [phone]  SOCIAL CONNECTIONS: -options in [place] if you wish to engage in more social and exercise related activities:  -Silver sneakers https://tools.silversneakers.com  -Walk with a Doc: http://www.duncan-williams.com/  -Check out the [facility] Active Adults 50+ section on the [place] of [employer] (hiking clubs, book clubs, cards and games, chess, exercise classes, aquatic classes and much more) - see the website for  details: https://www.[place].gov/departments/parks-recreation/active-adults50  -YouTube has lots of exercise videos for different ages and abilities as well  -[facility] (a variety of indoor and outdoor inperson activities for adults). 208-316-3253. [address]  -Virtual Online Classes (a variety of topics): see seniorplanet.org or call [phone]  -consider volunteering at a school, hospice center, church, senior center or elsewhere           Terressa Koyanagi, DO

## 2022-11-09 ENCOUNTER — Encounter: Payer: Self-pay | Admitting: Gastroenterology

## 2022-11-10 ENCOUNTER — Other Ambulatory Visit: Payer: Self-pay

## 2022-11-10 DIAGNOSIS — Z87891 Personal history of nicotine dependence: Secondary | ICD-10-CM

## 2022-11-10 DIAGNOSIS — F17210 Nicotine dependence, cigarettes, uncomplicated: Secondary | ICD-10-CM

## 2022-11-10 DIAGNOSIS — Z122 Encounter for screening for malignant neoplasm of respiratory organs: Secondary | ICD-10-CM

## 2022-11-22 DIAGNOSIS — K080 Exfoliation of teeth due to systemic causes: Secondary | ICD-10-CM | POA: Diagnosis not present

## 2022-11-23 ENCOUNTER — Ambulatory Visit
Admission: RE | Admit: 2022-11-23 | Discharge: 2022-11-23 | Disposition: A | Discharge status: Home / Self Care | Payer: Medicare Other | Source: Ambulatory Visit | Attending: Family Medicine | Admitting: Family Medicine

## 2022-11-23 DIAGNOSIS — F17210 Nicotine dependence, cigarettes, uncomplicated: Secondary | ICD-10-CM

## 2022-11-23 DIAGNOSIS — Z87891 Personal history of nicotine dependence: Secondary | ICD-10-CM

## 2022-11-23 DIAGNOSIS — Z122 Encounter for screening for malignant neoplasm of respiratory organs: Secondary | ICD-10-CM

## 2022-11-30 DIAGNOSIS — K080 Exfoliation of teeth due to systemic causes: Secondary | ICD-10-CM | POA: Diagnosis not present

## 2022-12-21 ENCOUNTER — Other Ambulatory Visit: Payer: Self-pay | Admitting: Acute Care

## 2022-12-21 DIAGNOSIS — Z87891 Personal history of nicotine dependence: Secondary | ICD-10-CM

## 2022-12-21 DIAGNOSIS — Z122 Encounter for screening for malignant neoplasm of respiratory organs: Secondary | ICD-10-CM

## 2022-12-21 DIAGNOSIS — F17210 Nicotine dependence, cigarettes, uncomplicated: Secondary | ICD-10-CM

## 2022-12-23 ENCOUNTER — Other Ambulatory Visit: Payer: Self-pay | Admitting: Family Medicine

## 2023-01-12 ENCOUNTER — Other Ambulatory Visit: Payer: Self-pay | Admitting: Family Medicine

## 2023-01-12 MED ORDER — SERTRALINE HCL 50 MG PO TABS: 50.0000 mg | ORAL_TABLET | Freq: Every day | ORAL | 0 refills | Status: DC

## 2023-01-12 NOTE — Telephone Encounter (Signed)
Copied from CRM [id number]. Topic: Clinical - Medication Refill >> Jan 12, 2023 11:18 AM Adele Barthel wrote: Most Recent Primary Care Visit:  Provider: Terressa Koyanagi  Department: [facility]  Visit Type: MEDICARE AWV, SEQUENTIAL  Date: 10/24/2022  Medication: sertraline (ZOLOFT) 50 MG tablet  Has the patient contacted their pharmacy? No (Agent: If no, request that the patient contact the pharmacy for the refill. If patient does not wish to contact the pharmacy document the reason why and proceed with request.) (Agent: If yes, when and what did the pharmacy advise?)  Is this the correct pharmacy for this prescription? Yes If no, delete pharmacy and type the correct one.  This is the patient's preferred pharmacy:  [id number] - [place], [place] - [address] [place] [address] Phone: [phone] Fax: [phone]   Has the prescription been filled recently? Yes  Is the patient out of the medication? No  Has the patient been seen for an appointment in the last year OR does the patient have an upcoming appointment? Yes  Can we respond through MyChart? Yes  Agent: Please be advised that Rx refills may take up to 3 business days. We ask that you follow-up with your pharmacy.

## 2023-01-15 ENCOUNTER — Ambulatory Visit (AMBULATORY_SURGERY_CENTER): Payer: Medicare Other

## 2023-01-15 VITALS — Ht 70.0 in | Wt 172.4 lb

## 2023-01-15 DIAGNOSIS — Z860100 Personal history of colon polyps, unspecified: Secondary | ICD-10-CM

## 2023-01-15 MED ORDER — NA SULFATE-K SULFATE-MG SULF 17.5-3.13-1.6 GM/177ML PO SOLN: 1.0000 | Freq: Once | ORAL | 0 refills | Status: AC

## 2023-01-15 NOTE — Progress Notes (Signed)

## 2023-01-25 ENCOUNTER — Encounter: Payer: Self-pay | Admitting: Gastroenterology

## 2023-01-30 ENCOUNTER — Ambulatory Visit: Payer: Medicare Other | Admitting: Gastroenterology

## 2023-01-30 ENCOUNTER — Encounter: Payer: Self-pay | Admitting: Gastroenterology

## 2023-01-30 VITALS — BP 147/75 | HR 47 | Temp 98.0°F | Resp 11 | Ht 70.0 in | Wt 172.0 lb

## 2023-01-30 DIAGNOSIS — K5730 Diverticulosis of large intestine without perforation or abscess without bleeding: Secondary | ICD-10-CM | POA: Diagnosis not present

## 2023-01-30 DIAGNOSIS — K644 Residual hemorrhoidal skin tags: Secondary | ICD-10-CM | POA: Diagnosis not present

## 2023-01-30 DIAGNOSIS — K648 Other hemorrhoids: Secondary | ICD-10-CM

## 2023-01-30 DIAGNOSIS — Z860101 Personal history of adenomatous and serrated colon polyps: Secondary | ICD-10-CM | POA: Diagnosis not present

## 2023-01-30 DIAGNOSIS — Z1211 Encounter for screening for malignant neoplasm of colon: Secondary | ICD-10-CM | POA: Diagnosis not present

## 2023-01-30 DIAGNOSIS — D123 Benign neoplasm of transverse colon: Secondary | ICD-10-CM

## 2023-01-30 DIAGNOSIS — D124 Benign neoplasm of descending colon: Secondary | ICD-10-CM

## 2023-01-30 DIAGNOSIS — Z860100 Personal history of colon polyps, unspecified: Secondary | ICD-10-CM

## 2023-01-30 DIAGNOSIS — K635 Polyp of colon: Secondary | ICD-10-CM | POA: Diagnosis not present

## 2023-01-30 MED ORDER — SODIUM CHLORIDE 0.9 % IV SOLN: 500.0000 mL | Freq: Once | INTRAVENOUS | Status: DC

## 2023-01-30 NOTE — Progress Notes (Signed)
 [place], gd SR, tolerated procedure well, VSS, report to RN

## 2023-01-30 NOTE — Op Note (Signed)
 [facility] Patient Name: Bobby Cuevas Procedure Date: 01/30/2023 10:42 AM MRN: [id number] Endoscopist: Aloha Finner , MD, [id number] Age: 73 Referring MD:  Date of Birth: 1950-11-05 Gender: Male Account #: [id number] Procedure:                Colonoscopy Indications:              Surveillance: Personal history of adenomatous                            polyps on last colonoscopy 5 years ago Medicines:                Monitored Anesthesia Care Procedure:                Pre-Anesthesia Assessment:                           - Prior to the procedure, a History and Physical                            was performed, and patient medications and                            allergies were reviewed. The patient's tolerance of                            previous anesthesia was also reviewed. The risks                            and benefits of the procedure and the sedation                            options and risks were discussed with the patient.                            All questions were answered, and informed consent                            was obtained. Prior Anticoagulants: The patient has                            taken no anticoagulant or antiplatelet agents                            except for aspirin. ASA Grade Assessment: II - A                            patient with mild systemic disease. After reviewing                            the risks and benefits, the patient was deemed in                            satisfactory condition to undergo the procedure.  After obtaining informed consent, the colonoscope                            was passed under direct vision. Throughout the                            procedure, the patient's blood pressure, pulse, and                            oxygen saturations were monitored continuously. The                            CF [id number] #[id number] was introduced through the anus                             and advanced to the 3 cm into the ileum. The                            colonoscopy was performed without difficulty. The                            patient tolerated the procedure. The quality of the                            bowel preparation was adequate. The terminal ileum,                            ileocecal valve, appendiceal orifice, and rectum                            were photographed. Scope In: 10:49:26 AM Scope Out: 11:05:44 AM Scope Withdrawal Time: 0 hours 14 minutes 30 seconds  Total Procedure Duration: 0 hours 16 minutes 18 seconds  Findings:                 The digital rectal exam findings include                            hemorrhoids. Pertinent negatives include no                            palpable rectal lesions.                           The terminal ileum and ileocecal valve appeared                            normal.                           Two sessile polyps were found in the descending                            colon and splenic flexure. The polyps were 4 to 5  mm in size. These polyps were removed with a cold                            snare. Resection and retrieval were complete.                           Multiple small-mouthed diverticula were found in                            the recto-sigmoid colon and sigmoid colon.                           Normal mucosa was found in the entire colon                            otherwise.                           Non-bleeding non-thrombosed external and internal                            hemorrhoids were found during retroflexion, during                            perianal exam and during digital exam. Complications:            No immediate complications. Estimated Blood Loss:     Estimated blood loss was minimal. Impression:               - Hemorrhoids found on digital rectal exam.                           - The examined portion of the ileum was normal.                            - Two 4 to 5 mm polyps in the descending colon and                            at the splenic flexure, removed with a cold snare.                            Resected and retrieved.                           - Diverticulosis in the recto-sigmoid colon and in                            the sigmoid colon.                           - Normal mucosa in the entire examined colon                            otherwise.                           -  Non-bleeding non-thrombosed external and internal                            hemorrhoids. Recommendation:           - The patient will be observed post-procedure,                            until all discharge criteria are met.                           - Discharge patient to home.                           - Patient has a contact number available for                            emergencies. The signs and symptoms of potential                            delayed complications were discussed with the                            patient. Return to normal activities tomorrow.                            Written discharge instructions were provided to the                            patient.                           - High fiber diet.                           - Use FiberCon 1-2 tablets PO daily.                           - Continue present medications.                           - Await pathology results.                           - Repeat colonoscopy in 5-7 years for surveillance                            based on pathology results and previous history of                            precancerous polyps.                           - The findings and recommendations were discussed                            with the patient.                           -  The findings and recommendations were discussed                            with the designated responsible adult. Aloha Finner, MD 01/30/2023 11:10:32 AM

## 2023-01-30 NOTE — Patient Instructions (Signed)
Please read handouts provided. Continue present medications. Await pathology results. High Fiber Diet. Use FiberCon 1-2 tablets daily.   YOU HAD AN ENDOSCOPIC PROCEDURE TODAY AT THE [facility]:   Refer to the procedure report that was given to you for any specific questions about what was found during the examination.  If the procedure report does not answer your questions, please call your gastroenterologist to clarify.  If you requested that your care partner not be given the details of your procedure findings, then the procedure report has been included in a sealed envelope for you to review at your convenience later.  YOU SHOULD EXPECT: Some feelings of bloating in the abdomen. Passage of more gas than usual.  Walking can help get rid of the air that was put into your GI tract during the procedure and reduce the bloating. If you had a lower endoscopy (such as a colonoscopy or flexible sigmoidoscopy) you may notice spotting of blood in your stool or on the toilet paper. If you underwent a bowel prep for your procedure, you may not have a normal bowel movement for a few days.  Please Note:  You might notice some irritation and congestion in your nose or some drainage.  This is from the oxygen used during your procedure.  There is no need for concern and it should clear up in a day or so.  SYMPTOMS TO REPORT IMMEDIATELY:  Following lower endoscopy (colonoscopy or flexible sigmoidoscopy):  Excessive amounts of blood in the stool  Significant tenderness or worsening of abdominal pains  Swelling of the abdomen that is new, acute  Fever of 100F or higher   For urgent or emergent issues, a gastroenterologist can be reached at any hour by calling [phone]. Do not use MyChart messaging for urgent concerns.    DIET:  We do recommend a small meal at first, but then you may proceed to your regular diet.  Drink plenty of fluids but you should avoid alcoholic beverages for 24  hours.  ACTIVITY:  You should plan to take it easy for the rest of today and you should NOT DRIVE or use heavy machinery until tomorrow (because of the sedation medicines used during the test).    FOLLOW UP: Our staff will call the number listed on your records the next business day following your procedure.  We will call around 7:15- 8:00 am to check on you and address any questions or concerns that you may have regarding the information given to you following your procedure. If we do not reach you, we will leave a message.     If any biopsies were taken you will be contacted by phone or by letter within the next 1-3 weeks.  Please call us at [phone] if you have not heard about the biopsies in 3 weeks.    SIGNATURES/CONFIDENTIALITY: You and/or your care partner have signed paperwork which will be entered into your electronic medical record.  These signatures attest to the fact that that the information above on your After Visit Summary has been reviewed and is understood.  Full responsibility of the confidentiality of this discharge information lies with you and/or your care-partner. 

## 2023-01-30 NOTE — Progress Notes (Signed)
 Pt's states no medical or surgical changes since previsit or office visit.

## 2023-01-30 NOTE — Progress Notes (Signed)
 Called to room to assist during endoscopic procedure.  Patient ID and intended procedure confirmed with present staff. Received instructions for my participation in the procedure from the performing physician.

## 2023-01-30 NOTE — Progress Notes (Signed)
 GASTROENTEROLOGY PROCEDURE H&P NOTE   Primary Care Physician: Johnny Garnette LABOR, MD  HPI: Bobby Cuevas is a 73 y.o. male who presents for Colonoscopy for surveillance of previous adenomas.  Past Medical History:  Diagnosis Date   Anxiety    COPD (chronic obstructive pulmonary disease) (HCC)    emphysema on CT    Hyperlipidemia    Hypertension    Tobacco abuse    Past Surgical History:  Procedure Laterality Date   APPENDECTOMY     COLONOSCOPY  04/18/2017   per Dr. Teressa, adenomatous polyps, repeat in 5 yrs    REDUCTION OF TORSION OF TESTIS  at age 73   SPINE SURGERY     Current Outpatient Medications  Medication Sig Dispense Refill   aspirin 81 MG tablet Take 81 mg by mouth daily.     doxazosin  (CARDURA ) 8 MG tablet TAKE 1 TABLET BY MOUTH EVERYDAY AT BEDTIME 90 tablet 0   hydrochlorothiazide  (MICROZIDE ) 12.5 MG capsule TAKE 1 CAPSULE (12.5 MG TOTAL) BY MOUTH IN THE MORNING. 90 capsule 3   losartan  (COZAAR ) 25 MG tablet TAKE 1 TABLET (25 MG TOTAL) BY MOUTH DAILY. 90 tablet 0   sertraline  (ZOLOFT ) 50 MG tablet Take 1 tablet (50 mg total) by mouth daily. 90 tablet 0   sildenafil  (VIAGRA ) 100 MG tablet TAKE 1/2 TO 1 TABLET (50-100 MG TOTAL) BY MOUTH AS NEEDED FOR ERECTILE DYSFUNCTION 10 tablet 11   Current Facility-Administered Medications  Medication Dose Route Frequency Provider Last Rate Last Admin   0.9 %  sodium chloride  infusion  500 mL Intravenous Once Teressa Toribio SQUIBB, MD        Current Outpatient Medications:    aspirin 81 MG tablet, Take 81 mg by mouth daily., Disp: , Rfl:    doxazosin  (CARDURA ) 8 MG tablet, TAKE 1 TABLET BY MOUTH EVERYDAY AT BEDTIME, Disp: 90 tablet, Rfl: 0   hydrochlorothiazide  (MICROZIDE ) 12.5 MG capsule, TAKE 1 CAPSULE (12.5 MG TOTAL) BY MOUTH IN THE MORNING., Disp: 90 capsule, Rfl: 3   losartan  (COZAAR ) 25 MG tablet, TAKE 1 TABLET (25 MG TOTAL) BY MOUTH DAILY., Disp: 90 tablet, Rfl: 0   sertraline  (ZOLOFT ) 50 MG tablet, Take 1 tablet (50  mg total) by mouth daily., Disp: 90 tablet, Rfl: 0   sildenafil  (VIAGRA ) 100 MG tablet, TAKE 1/2 TO 1 TABLET (50-100 MG TOTAL) BY MOUTH AS NEEDED FOR ERECTILE DYSFUNCTION, Disp: 10 tablet, Rfl: 11  Current Facility-Administered Medications:    0.9 %  sodium chloride  infusion, 500 mL, Intravenous, Once, Teressa Toribio SQUIBB, MD Allergies  Allergen Reactions   Wellbutrin [Bupropion] Hives    Rash on feet   Family History  Problem Relation Age of Onset   Lung disease Father    Lung cancer Father    Alcohol abuse Other    Hypertension Other    Cancer Other    Lung disease Other    Colon cancer Neg Hx    Esophageal cancer Neg Hx    Stomach cancer Neg Hx    Rectal cancer Neg Hx    Liver cancer Neg Hx    Pancreatic cancer Neg Hx    Prostate cancer Neg Hx    Social History   Socioeconomic History   Marital status: Divorced    Spouse name: Not on file   Number of children: Not on file   Years of education: Not on file   Highest education level: Not on file  Occupational History   Not on file  Tobacco Use   Smoking status: Every Day    Current packs/day: 1.00    Average packs/day: 1 pack/day for 48.0 years (48.0 ttl pk-yrs)    Types: Cigarettes   Smokeless tobacco: Never   Tobacco comments:    Counseled on smoking cessation  Vaping Use   Vaping status: Never Used  Substance and Sexual Activity   Alcohol use: Yes    Alcohol/week: 0.0 standard drinks of alcohol    Comment: 1-2 beers per month per pt   Drug use: No   Sexual activity: Yes  Other Topics Concern   Not on file  Social History Narrative   Not on file   Social Drivers of Health   Financial Resource Strain: Low Risk  (10/19/2021)   Overall Financial Resource Strain (CARDIA)    Difficulty of Paying Living Expenses: Not hard at all  Food Insecurity: No Food Insecurity (10/19/2021)   Hunger Vital Sign    Worried About Running Out of Food in the Last Year: Never true    Ran Out of Food in the Last Year: Never true   Transportation Needs: No Transportation Needs (10/19/2021)   PRAPARE - Administrator, Civil Service (Medical): No    Lack of Transportation (Non-Medical): No  Physical Activity: Inactive (10/19/2021)   Exercise Vital Sign    Days of Exercise per Week: 0 days    Minutes of Exercise per Session: 0 min  Stress: No Stress Concern Present (10/19/2021)   [employer] of Occupational Health - Occupational Stress Questionnaire    Feeling of Stress : Not at all  Social Connections: Moderately Integrated (10/19/2021)   Social Connection and Isolation Panel [NHANES]    Frequency of Communication with Friends and Family: More than three times a week    Frequency of Social Gatherings with Friends and Family: More than three times a week    Attends Religious Services: More than 4 times per year    Active Member of [employer] or Organizations: Yes    Attends Engineer, Structural: More than 4 times per year    Marital Status: Divorced  Intimate Partner Violence: Not At Risk (10/19/2021)   Humiliation, Afraid, Rape, and Kick questionnaire    Fear of Current or Ex-Partner: No    Emotionally Abused: No    Physically Abused: No    Sexually Abused: No    Physical Exam: There were no vitals filed for this visit. There is no height or weight on file to calculate BMI. GEN: NAD EYE: Sclerae anicteric ENT: MMM CV: Non-tachycardic GI: Soft, NT/ND NEURO:  Alert & Oriented x 3  Lab Results: No results for input(s): WBC, HGB, HCT, PLT in the last 72 hours. BMET No results for input(s): NA, K, CL, CO2, GLUCOSE, BUN, CREATININE, CALCIUM in the last 72 hours. LFT No results for input(s): PROT, ALBUMIN, AST, ALT, ALKPHOS, BILITOT, BILIDIR, IBILI in the last 72 hours. PT/INR No results for input(s): LABPROT, INR in the last 72 hours.   Impression / Plan: This is a 73 y.o.male  who presents for Colonoscopy for surveillance of previous  adenomas.  The risks and benefits of endoscopic evaluation/treatment were discussed with the patient and/or family; these include but are not limited to the risk of perforation, infection, bleeding, missed lesions, lack of diagnosis, severe illness requiring hospitalization, as well as anesthesia and sedation related illnesses.  The patient's history has been reviewed, patient examined, no change in status, and deemed stable for procedure.  The  patient and/or family is agreeable to proceed.    Aloha Finner, MD [facility] Office # [phone]

## 2023-01-31 ENCOUNTER — Telehealth: Payer: Self-pay

## 2023-01-31 NOTE — Telephone Encounter (Signed)
  Follow up Call-     01/30/2023   10:17 AM  Call back number  Post procedure Call Back phone  # [phone]  Permission to leave phone message Yes     Patient questions:  Do you have a fever, pain , or abdominal swelling? No. Pain Score  0 *  Have you tolerated food without any problems? Yes.    Have you been able to return to your normal activities? Yes.    Do you have any questions about your discharge instructions: Diet   No. Medications  No. Follow up visit  No.  Do you have questions or concerns about your Care? No.  Actions: * If pain score is 4 or above: No action needed, pain <4.

## 2023-02-01 ENCOUNTER — Encounter: Payer: Self-pay | Admitting: Gastroenterology

## 2023-02-01 LAB — SURGICAL PATHOLOGY

## 2023-04-10 ENCOUNTER — Other Ambulatory Visit: Payer: Self-pay | Admitting: Family Medicine

## 2023-06-25 ENCOUNTER — Other Ambulatory Visit: Payer: Self-pay | Admitting: Family Medicine

## 2023-07-01 ENCOUNTER — Other Ambulatory Visit: Payer: Self-pay | Admitting: Family Medicine

## 2023-07-04 ENCOUNTER — Ambulatory Visit (INDEPENDENT_AMBULATORY_CARE_PROVIDER_SITE_OTHER): Admitting: Family Medicine

## 2023-07-04 ENCOUNTER — Encounter: Payer: Self-pay | Admitting: Family Medicine

## 2023-07-04 VITALS — BP 128/70 | HR 45 | Temp 98.5°F | Ht 70.0 in | Wt 173.0 lb

## 2023-07-04 DIAGNOSIS — N401 Enlarged prostate with lower urinary tract symptoms: Secondary | ICD-10-CM

## 2023-07-04 DIAGNOSIS — J439 Emphysema, unspecified: Secondary | ICD-10-CM

## 2023-07-04 DIAGNOSIS — N529 Male erectile dysfunction, unspecified: Secondary | ICD-10-CM | POA: Diagnosis not present

## 2023-07-04 DIAGNOSIS — I10 Essential (primary) hypertension: Secondary | ICD-10-CM | POA: Diagnosis not present

## 2023-07-04 DIAGNOSIS — E7800 Pure hypercholesterolemia, unspecified: Secondary | ICD-10-CM

## 2023-07-04 DIAGNOSIS — N138 Other obstructive and reflux uropathy: Secondary | ICD-10-CM

## 2023-07-04 DIAGNOSIS — F419 Anxiety disorder, unspecified: Secondary | ICD-10-CM

## 2023-07-04 DIAGNOSIS — R739 Hyperglycemia, unspecified: Secondary | ICD-10-CM

## 2023-07-04 LAB — HEPATIC FUNCTION PANEL
ALT: 10 U/L (ref 0–53)
AST: 12 U/L (ref 0–37)
Albumin: 4.1 g/dL (ref 3.5–5.2)
Alkaline Phosphatase: 64 U/L (ref 39–117)
Bilirubin, Direct: 0.1 mg/dL (ref 0.0–0.3)
Total Bilirubin: 0.6 mg/dL (ref 0.2–1.2)
Total Protein: 6.4 g/dL (ref 6.0–8.3)

## 2023-07-04 LAB — PSA: PSA: 0.97 ng/mL (ref 0.10–4.00)

## 2023-07-04 LAB — CBC WITH DIFFERENTIAL/PLATELET
Basophils Absolute: 0 10*3/uL (ref 0.0–0.1)
Basophils Relative: 0.7 % (ref 0.0–3.0)
Eosinophils Absolute: 0.2 10*3/uL (ref 0.0–0.7)
Eosinophils Relative: 3.6 % (ref 0.0–5.0)
HCT: 43 % (ref 39.0–52.0)
Hemoglobin: 14.5 g/dL (ref 13.0–17.0)
Lymphocytes Relative: 29.3 % (ref 12.0–46.0)
Lymphs Abs: 2 10*3/uL (ref 0.7–4.0)
MCHC: 33.6 g/dL (ref 30.0–36.0)
MCV: 98.4 fl (ref 78.0–100.0)
Monocytes Absolute: 0.5 10*3/uL (ref 0.1–1.0)
Monocytes Relative: 7.3 % (ref 3.0–12.0)
Neutro Abs: 4 10*3/uL (ref 1.4–7.7)
Neutrophils Relative %: 59.1 % (ref 43.0–77.0)
Platelets: 128 10*3/uL — ABNORMAL LOW (ref 150.0–400.0)
RBC: 4.37 Mil/uL (ref 4.22–5.81)
RDW: 13.3 % (ref 11.5–15.5)
WBC: 6.8 10*3/uL (ref 4.0–10.5)

## 2023-07-04 LAB — LIPID PANEL
Cholesterol: 151 mg/dL (ref 0–200)
HDL: 38.3 mg/dL — ABNORMAL LOW (ref >39.00)
LDL Cholesterol: 100 mg/dL — ABNORMAL HIGH (ref 0–99)
NonHDL: 112.82
Total CHOL/HDL Ratio: 4
Triglycerides: 64 mg/dL (ref 0.0–149.0)
VLDL: 12.8 mg/dL (ref 0.0–40.0)

## 2023-07-04 LAB — TSH: TSH: 1.73 u[IU]/mL (ref 0.35–5.50)

## 2023-07-04 LAB — BASIC METABOLIC PANEL WITH GFR
BUN: 24 mg/dL — ABNORMAL HIGH (ref 6–23)
CO2: 30 meq/L (ref 19–32)
Calcium: 8.6 mg/dL (ref 8.4–10.5)
Chloride: 107 meq/L (ref 96–112)
Creatinine, Ser: 1 mg/dL (ref 0.40–1.50)
GFR: 75.73 mL/min (ref >60.00)
Glucose, Bld: 87 mg/dL (ref 70–99)
Potassium: 4.3 meq/L (ref 3.5–5.1)
Sodium: 141 meq/L (ref 135–145)

## 2023-07-04 LAB — HEMOGLOBIN A1C: Hgb A1c MFr Bld: 5.3 % (ref 4.6–6.5)

## 2023-07-04 MED ORDER — DOXAZOSIN MESYLATE 8 MG PO TABS: 8.0000 mg | ORAL_TABLET | Freq: Every day | ORAL | 3 refills | Status: AC

## 2023-07-04 MED ORDER — HYDROCHLOROTHIAZIDE 12.5 MG PO CAPS: 12.5000 mg | ORAL_CAPSULE | Freq: Every morning | ORAL | 3 refills | Status: AC

## 2023-07-04 MED ORDER — SILDENAFIL CITRATE 100 MG PO TABS: 100.0000 mg | ORAL_TABLET | ORAL | 11 refills | Status: AC | PRN

## 2023-07-04 MED ORDER — LOSARTAN POTASSIUM 25 MG PO TABS: 25.0000 mg | ORAL_TABLET | Freq: Every day | ORAL | 3 refills | Status: AC

## 2023-07-04 MED ORDER — SERTRALINE HCL 50 MG PO TABS: 50.0000 mg | ORAL_TABLET | Freq: Every day | ORAL | 3 refills | Status: AC

## 2023-07-04 NOTE — Progress Notes (Signed)
 Subjective:    Patient ID: Bobby Cuevas, male    DOB: 29-Sep-1950, 73 y.o.   MRN: [id number]  HPI Here to follow up on issues. He feels fine. He works at [employer] 4 days a week, so he is on his feet all day. His BP has been stable. His ED is stable. His anxiety is stable.    Review of Systems  Constitutional: Negative.   HENT: Negative.    Eyes: Negative.   Respiratory: Negative.    Cardiovascular: Negative.   Gastrointestinal: Negative.   Genitourinary: Negative.   Musculoskeletal: Negative.   Skin: Negative.   Neurological: Negative.   Psychiatric/Behavioral: Negative.         Objective:   Physical Exam Constitutional:      General: He is not in acute distress.    Appearance: Normal appearance. He is well-developed. He is not diaphoretic.  HENT:     Head: Normocephalic and atraumatic.     Right Ear: External ear normal.     Left Ear: External ear normal.     Nose: Nose normal.     Mouth/Throat:     Pharynx: No oropharyngeal exudate.   Eyes:     General: No scleral icterus.       Right eye: No discharge.        Left eye: No discharge.     Conjunctiva/sclera: Conjunctivae normal.     Pupils: Pupils are equal, round, and reactive to light.   Neck:     Thyroid : No thyromegaly.     Vascular: No JVD.     Trachea: No tracheal deviation.   Cardiovascular:     Rate and Rhythm: Normal rate and regular rhythm.     Pulses: Normal pulses.     Heart sounds: Normal heart sounds. No murmur heard.    No friction rub. No gallop.  Pulmonary:     Effort: Pulmonary effort is normal. No respiratory distress.     Breath sounds: Normal breath sounds. No wheezing or rales.  Chest:     Chest wall: No tenderness.  Abdominal:     General: Bowel sounds are normal. There is no distension.     Palpations: Abdomen is soft. There is no mass.     Tenderness: There is no abdominal tenderness. There is no guarding or rebound.  Genitourinary:    Penis: Normal. No tenderness.       Testes: Normal.     Prostate: Normal.     Rectum: Normal. Guaiac result negative.   Musculoskeletal:        General: No tenderness. Normal range of motion.     Cervical back: Neck supple.  Lymphadenopathy:     Cervical: No cervical adenopathy.   Skin:    General: Skin is warm and dry.     Coloration: Skin is not pale.     Findings: No erythema or rash.   Neurological:     General: No focal deficit present.     Mental Status: He is alert and oriented to person, place, and time.     Cranial Nerves: No cranial nerve deficit.     Motor: No abnormal muscle tone.     Coordination: Coordination normal.     Deep Tendon Reflexes: Reflexes are normal and symmetric. Reflexes normal.   Psychiatric:        Mood and Affect: Mood normal.        Behavior: Behavior normal.        Thought Content: Thought  content normal.        Judgment: Judgment normal.           Assessment & Plan:  His HTN and ED are stable. His anxiety is stable. We will get labs to check lipids, etc. We spent a total of (  32 ) minutes reviewing records and discussing these issues.  Corita Diego, MD

## 2023-07-06 ENCOUNTER — Ambulatory Visit: Payer: Self-pay | Admitting: Family Medicine

## 2023-09-19 DIAGNOSIS — K080 Exfoliation of teeth due to systemic causes: Secondary | ICD-10-CM | POA: Diagnosis not present

## 2023-11-28 ENCOUNTER — Inpatient Hospital Stay
Admission: RE | Admit: 2023-11-28 | Discharge: 2023-11-28 | Disposition: A | Source: Ambulatory Visit | Attending: Acute Care | Admitting: Acute Care

## 2023-11-28 DIAGNOSIS — F17210 Nicotine dependence, cigarettes, uncomplicated: Secondary | ICD-10-CM

## 2023-11-28 DIAGNOSIS — Z87891 Personal history of nicotine dependence: Secondary | ICD-10-CM

## 2023-11-28 DIAGNOSIS — Z122 Encounter for screening for malignant neoplasm of respiratory organs: Secondary | ICD-10-CM

## 2023-12-05 ENCOUNTER — Other Ambulatory Visit: Payer: Self-pay

## 2023-12-05 DIAGNOSIS — F17210 Nicotine dependence, cigarettes, uncomplicated: Secondary | ICD-10-CM

## 2023-12-05 DIAGNOSIS — Z122 Encounter for screening for malignant neoplasm of respiratory organs: Secondary | ICD-10-CM

## 2023-12-05 DIAGNOSIS — Z87891 Personal history of nicotine dependence: Secondary | ICD-10-CM

## 2024-02-21 ENCOUNTER — Ambulatory Visit: Admitting: Family Medicine

## 2024-04-02 ENCOUNTER — Ambulatory Visit
